# Patient Record
Sex: Female | Born: 1950 | Race: White | Hispanic: No | Marital: Married | Smoking: Former smoker
Health system: Southern US, Community
[De-identification: ages and names within clinical notes are randomized; demographics above are authoritative.]

## PROBLEM LIST (undated history)

## (undated) DIAGNOSIS — K219 Gastro-esophageal reflux disease without esophagitis: Secondary | ICD-10-CM

## (undated) DIAGNOSIS — C801 Malignant (primary) neoplasm, unspecified: Secondary | ICD-10-CM

## (undated) DIAGNOSIS — E785 Hyperlipidemia, unspecified: Secondary | ICD-10-CM

## (undated) HISTORY — PX: BACK SURGERY: SHX140

## (undated) HISTORY — DX: Gastro-esophageal reflux disease without esophagitis: K21.9

## (undated) HISTORY — DX: Hyperlipidemia, unspecified: E78.5

## (undated) HISTORY — PX: APPENDECTOMY: SHX54

## (undated) HISTORY — PX: ABDOMINAL HYSTERECTOMY: SHX81

## (undated) HISTORY — PX: FOOT SURGERY: SHX648

## (undated) HISTORY — PX: BREAST SURGERY: SHX581

---

## 1999-01-29 ENCOUNTER — Other Ambulatory Visit: Admission: RE | Admit: 1999-01-29 | Discharge: 1999-01-29 | Payer: Self-pay | Admitting: Gynecology

## 2000-03-03 ENCOUNTER — Other Ambulatory Visit: Admission: RE | Admit: 2000-03-03 | Discharge: 2000-03-03 | Payer: Self-pay | Admitting: Gynecology

## 2001-04-03 ENCOUNTER — Other Ambulatory Visit: Admission: RE | Admit: 2001-04-03 | Discharge: 2001-04-03 | Payer: Self-pay | Admitting: Gynecology

## 2002-04-12 ENCOUNTER — Other Ambulatory Visit: Admission: RE | Admit: 2002-04-12 | Discharge: 2002-04-12 | Payer: Self-pay | Admitting: Gynecology

## 2003-02-12 ENCOUNTER — Inpatient Hospital Stay (HOSPITAL_COMMUNITY): Admission: RE | Admit: 2003-02-12 | Discharge: 2003-02-14 | Payer: Self-pay | Admitting: Gynecology

## 2003-02-12 ENCOUNTER — Encounter (INDEPENDENT_AMBULATORY_CARE_PROVIDER_SITE_OTHER): Payer: Self-pay | Admitting: Specialist

## 2003-04-30 ENCOUNTER — Emergency Department (HOSPITAL_COMMUNITY): Admission: EM | Admit: 2003-04-30 | Discharge: 2003-04-30 | Payer: Self-pay | Admitting: Emergency Medicine

## 2003-04-30 ENCOUNTER — Encounter: Payer: Self-pay | Admitting: Emergency Medicine

## 2005-09-16 ENCOUNTER — Other Ambulatory Visit: Admission: RE | Admit: 2005-09-16 | Discharge: 2005-09-16 | Payer: Self-pay | Admitting: Gynecology

## 2007-06-02 IMAGING — US US ABDOMEN COMPLETE
1 series · 14 of 25 positions shown · non-contrast
Comparison: NONE

CLINICAL DATA: Family history of pancreatic cancer and question 
of  gallstones. 

ABDOMINAL ULTRASOUND

[Series 1: us abd · 0.33mm/px · 14 of 58 slices shown]
[im 1/58]
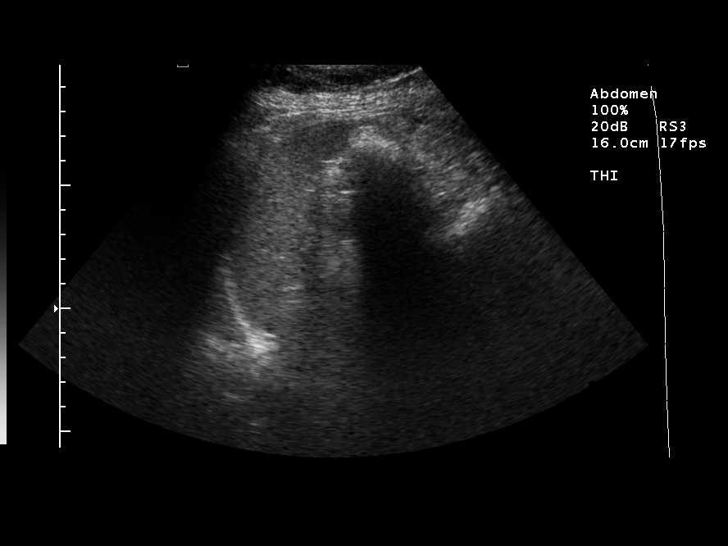
[im 5/58]
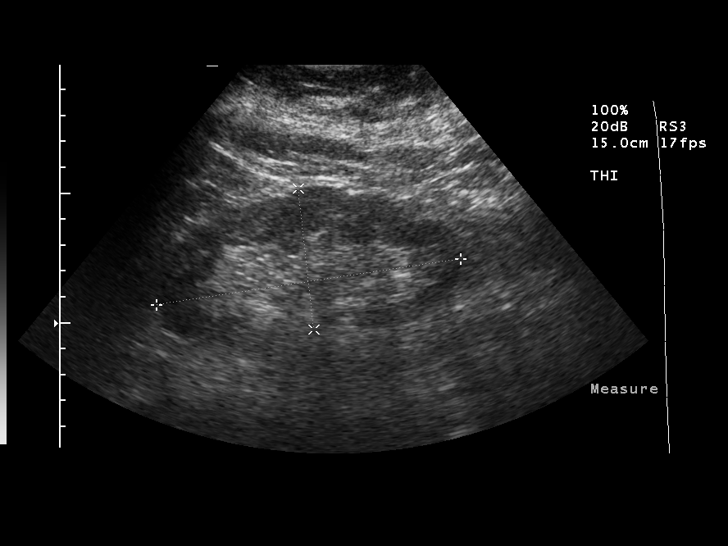
[im 10/58]
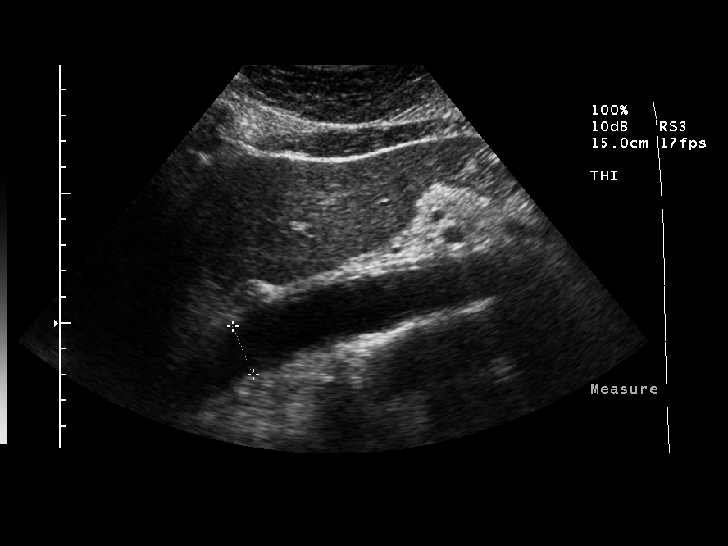
[im 15/58]
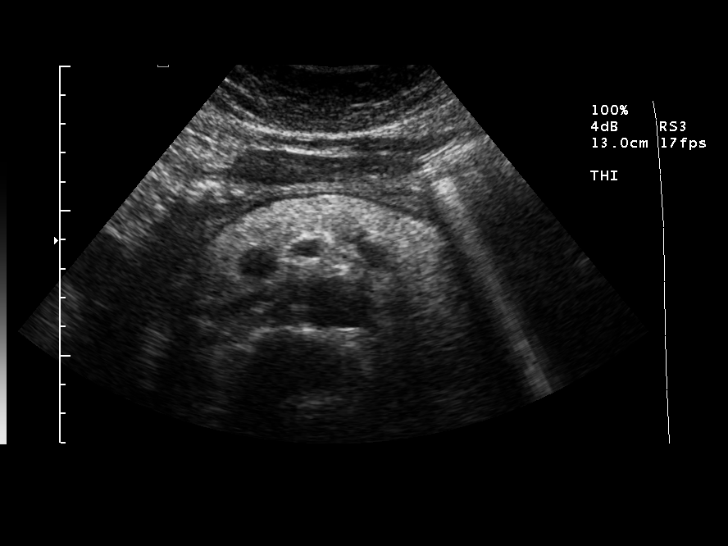
[im 20/58]
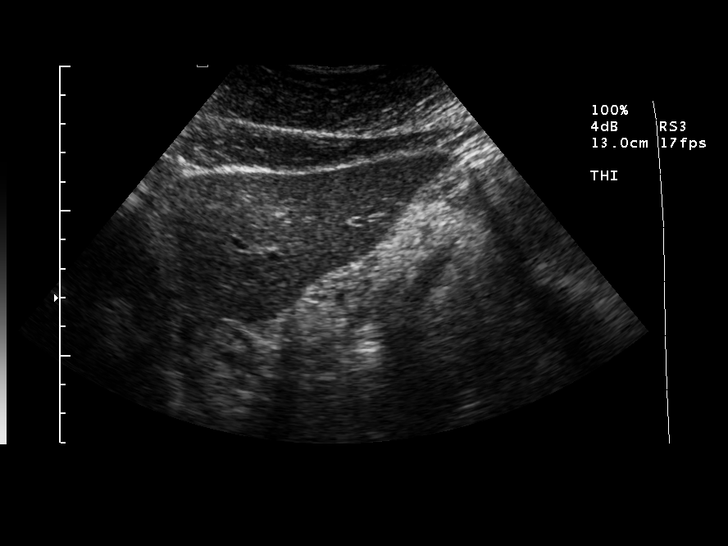
[im 22/58]
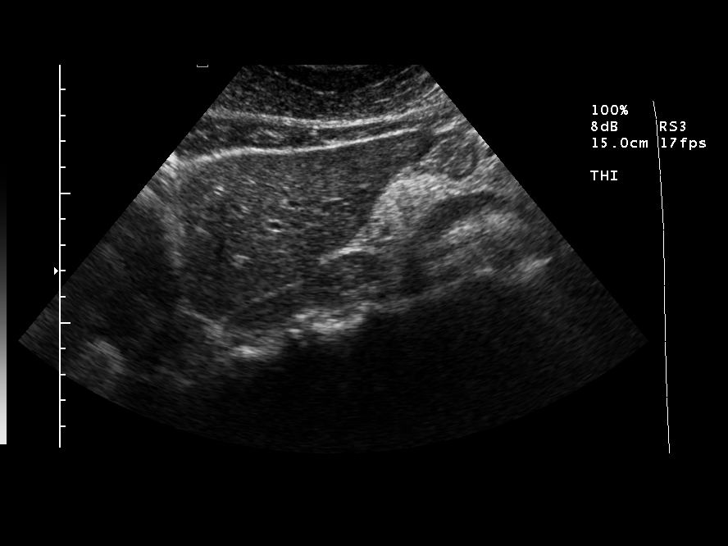
[im 27/58]
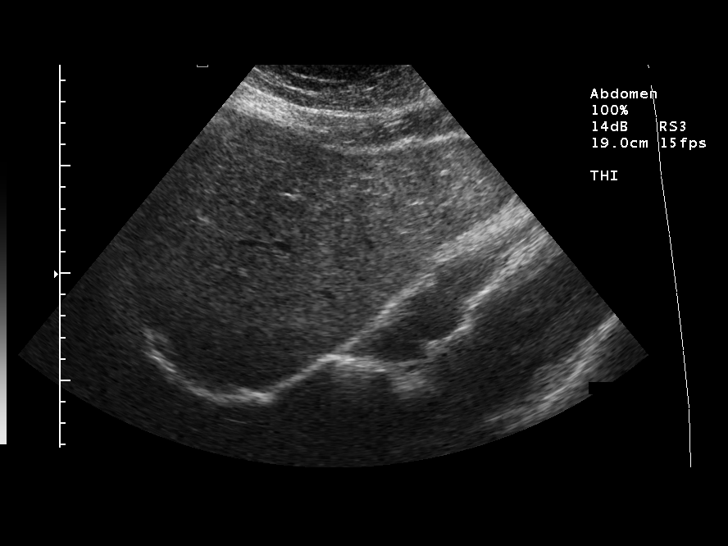
[im 31/58]
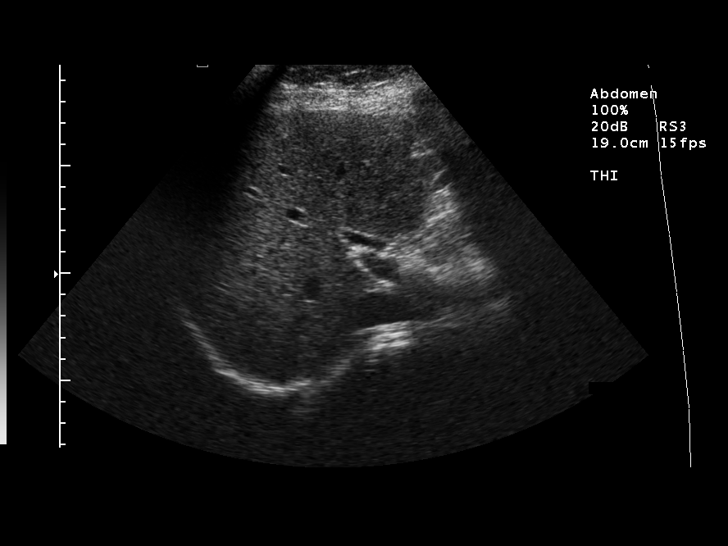
[im 36/58]
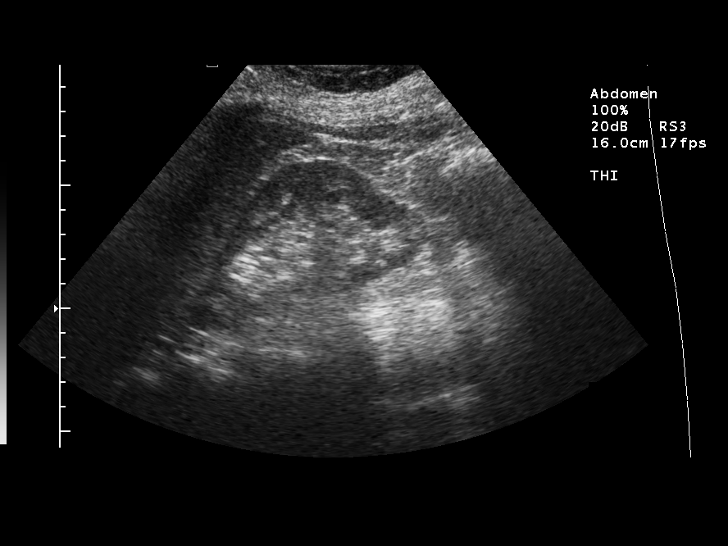
[im 39/58]
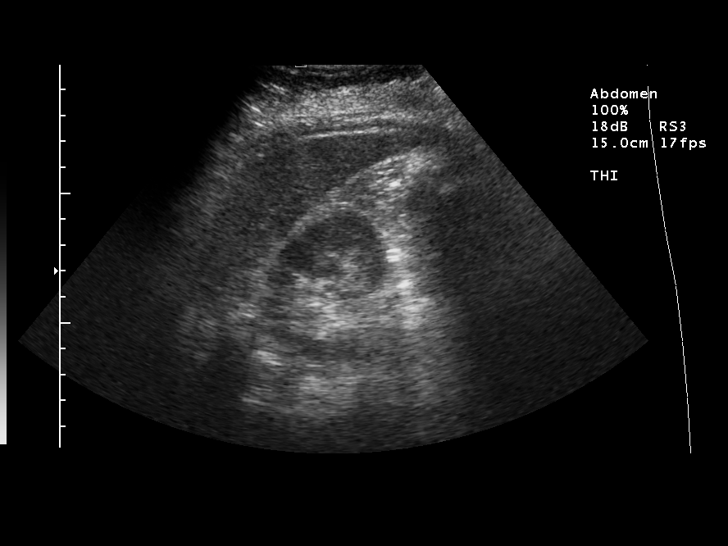
[im 43/58]
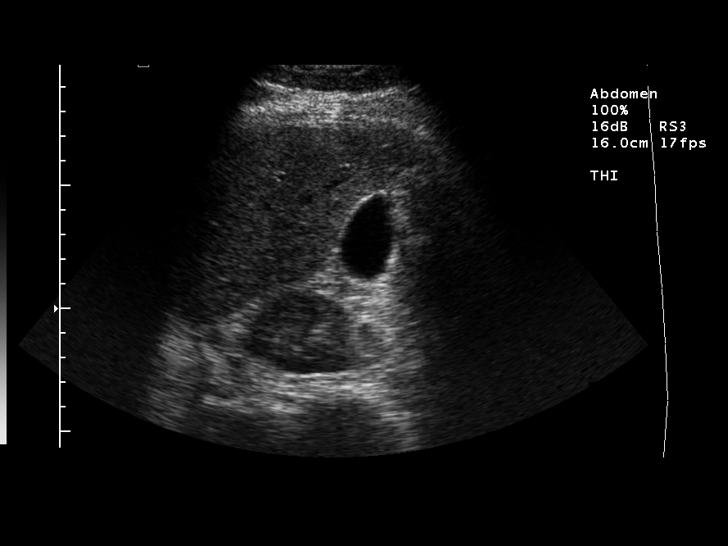
[im 48/58]
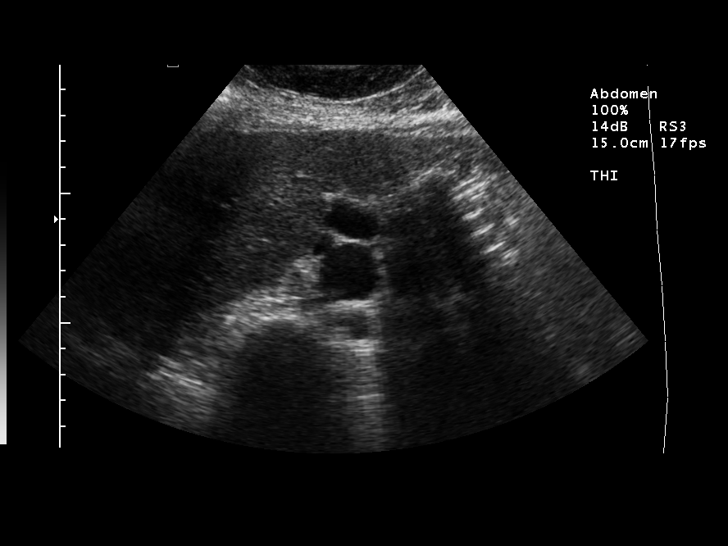
[im 53/58]
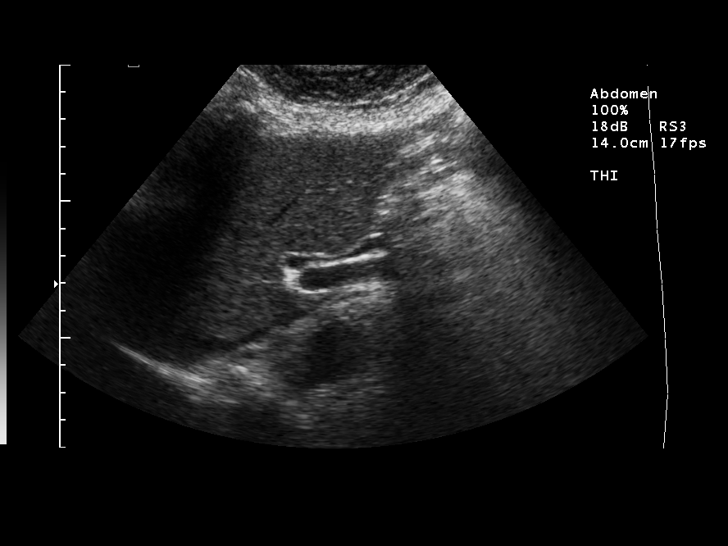
[im 58/58]
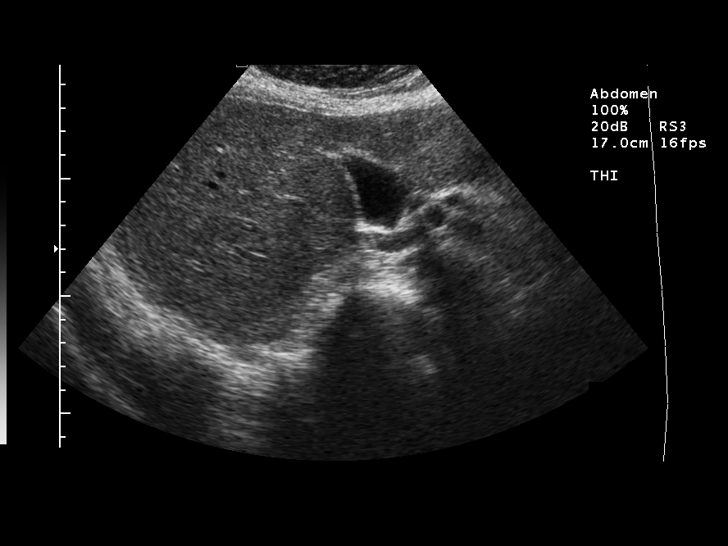

[14 of 25 positions shown; findings below may reference images not displayed]

FINDINGS: Spleen is normal in appearance. The left kidney is
x 5.5 x 4.7 cm.The right kidney is 10.6 x 5.5 x 3.9 cm. The aorta 
is normal in caliber. There is some scattered atherosclerotic 
calcification but no aneurysm or dilatation. The pancreas is 
normal in echogenicity and contour, with no pancreatic ductal 
dilatation or pancreatic masses. The liver is homogeneous. The 
gallbladder is normal in appearance, with no wall thickening or 
filling defects. The common duct is normal at 2.8 mm.
IMPRESSION: Atherosclerotic calcification in the aorta. Pancreas 
is normal. Gallbladder is normal with no intraluminal stones. 
Macey Phan, M.D. Electronically reviewed on 06/02/2007 Dict 
Date: 06/02/2007  Trans Date: 06/02/2007 DL  [REDACTED]

## 2009-05-26 ENCOUNTER — Encounter (INDEPENDENT_AMBULATORY_CARE_PROVIDER_SITE_OTHER): Payer: Self-pay | Admitting: Specialist

## 2009-05-26 ENCOUNTER — Ambulatory Visit (HOSPITAL_BASED_OUTPATIENT_CLINIC_OR_DEPARTMENT_OTHER): Admission: RE | Admit: 2009-05-26 | Discharge: 2009-05-27 | Payer: Self-pay | Admitting: Specialist

## 2010-08-05 ENCOUNTER — Ambulatory Visit: Payer: Self-pay | Admitting: Cardiology

## 2010-08-05 LAB — LIPID PANEL: Cholesterol: 175 mg/dL (ref 0–200)

## 2010-10-16 ENCOUNTER — Encounter: Payer: Self-pay | Admitting: Cardiology

## 2010-10-16 DIAGNOSIS — E785 Hyperlipidemia, unspecified: Secondary | ICD-10-CM | POA: Insufficient documentation

## 2010-10-16 DIAGNOSIS — K219 Gastro-esophageal reflux disease without esophagitis: Secondary | ICD-10-CM | POA: Insufficient documentation

## 2010-12-04 ENCOUNTER — Encounter: Payer: Self-pay | Admitting: Cardiology

## 2010-12-04 ENCOUNTER — Ambulatory Visit (INDEPENDENT_AMBULATORY_CARE_PROVIDER_SITE_OTHER): Payer: 59 | Admitting: Cardiology

## 2010-12-04 DIAGNOSIS — Z79899 Other long term (current) drug therapy: Secondary | ICD-10-CM

## 2010-12-04 DIAGNOSIS — E7800 Pure hypercholesterolemia, unspecified: Secondary | ICD-10-CM

## 2010-12-04 DIAGNOSIS — R109 Unspecified abdominal pain: Secondary | ICD-10-CM | POA: Insufficient documentation

## 2010-12-04 DIAGNOSIS — E785 Hyperlipidemia, unspecified: Secondary | ICD-10-CM

## 2010-12-04 DIAGNOSIS — I119 Hypertensive heart disease without heart failure: Secondary | ICD-10-CM

## 2010-12-04 LAB — LIPID PANEL
HDL: 52 mg/dL (ref >39)
Total CHOL/HDL Ratio: 3.1 Ratio

## 2010-12-04 LAB — COMPREHENSIVE METABOLIC PANEL
ALT: 11 U/L (ref 0–35)
AST: 16 U/L (ref 0–37)
Albumin: 4.2 g/dL (ref 3.5–5.2)
CO2: 27 mEq/L (ref 19–32)
Creat: 0.76 mg/dL (ref 0.40–1.20)

## 2010-12-04 NOTE — Assessment & Plan Note (Signed)
Continue present medication continue efforts at weight loss.

## 2010-12-04 NOTE — Assessment & Plan Note (Signed)
She has been to urgent care for her flank pain and nothing was found.  No evidence of urinary tract infection.  Apparently no gross hematuria or pyuria.  If she has more problems she will contact Dr. Isabel Caprice evaluate the possibility of a left kidney stone further

## 2010-12-04 NOTE — Progress Notes (Signed)
HPI: Recurrent flank pain, usually on left side.  No hematuria. No fever. Has not seen urollogy.If she has any recurrent problems she will contact her husbands urologist.  From a cardiac standpoint she's been doing well with no chest pain or shortness of breath.  No dizziness or syncope no side effects from the Lipitor.  No dizzy spells or syncope.  Current Outpatient Prescriptions  Medication Sig Dispense Refill  . atorvastatin (LIPITOR) 80 MG tablet Take 80 mg by mouth daily.        . Cholecalciferol (VITAMIN D) 1000 UNITS capsule Take 1,000 Units by mouth daily. OCCASIONALLY       . esomeprazole (NEXIUM) 40 MG capsule Take 40 mg by mouth daily.        Marland Kitchen estrogens, conjugated, (PREMARIN) 0.9 MG tablet Take 0.9 mg by mouth daily. Take daily for 21 days then do not take for 7 days.       Marland Kitchen HYDROCODONE-ACETAMINOPHEN PO Take by mouth as needed.        Marland Kitchen losartan-hydrochlorothiazide (HYZAAR) 100-12.5 MG per tablet Take 1 tablet by mouth daily.        . Multiple Vitamin (MULTIVITAMIN) tablet Take 1 tablet by mouth daily.          Allergies  Allergen Reactions  . Sulfa Antibiotics     Patient Active Problem List  Diagnoses  . Hyperlipidemia  . GERD (gastroesophageal reflux disease)    History  Smoking status  . Former Smoker  Smokeless tobacco  . Not on file    History  Alcohol Use     Family History  Problem Relation Age of Onset  . Coronary artery disease Father   . Coronary artery disease Mother     Review of Systems: The patient denies any heat or cold intolerance.  No weight gain or weight loss.  The patient denies headaches or blurry vision.  There is no cough or sputum production.  The patient denies dizziness.  There is no hematuria or hematochezia.  The patient denies any muscle aches or arthritis.  The patient denies any rash.  The patient denies frequent falling or instability.  There is no history of depression or anxiety.  All other systems were reviewed and are  negative.   Physical Exam: Vital signs as recorded.Pupils equal and reactive.   Extraocular Movements are full.  There is no scleral icterus.  The mouth and pharynx are normal.  The neck is supple.  The carotids reveal no bruits.  The jugular venous pressure is normal.  The thyroid is not enlarged.  There is no lymphadenopathy.The chest is clear to percussion and auscultation. There are no rales or rhonchi. Expansion of the chest is symmetrical.The precordium is quiet.  The first heart sound is normal.  The second heart sound is physiologically split.  There is no murmur gallop rub or click.  There is no abnormal lift or heave.The abdomen is soft and nontender. Bowel sounds are normal. The liver and spleen are not enlarged. There Are no abdominal masses. There are no bruits.The pedal pulses are good.  There is no phlebitis or edema.  There is no cyanosis or clubbing. There is no flank tenderness   Assessment / Plan:

## 2010-12-07 NOTE — Progress Notes (Signed)
Quick Note:  Continue same Rx. Trigllycerides still high. Strict diet! ______

## 2010-12-15 NOTE — Progress Notes (Signed)
Left message for patient to call back  

## 2010-12-16 ENCOUNTER — Telehealth: Payer: Self-pay | Admitting: *Deleted

## 2010-12-16 NOTE — Telephone Encounter (Signed)
Message copied by Regis Bill on Wed Dec 16, 2010 12:22 PM ------      Message from: Cassell Clement      Created: Mon Dec 07, 2010  3:12 PM       Continue same Rx.  Trigllycerides still high.  Strict diet!

## 2010-12-16 NOTE — Telephone Encounter (Signed)
Advised patient of labs 

## 2010-12-18 LAB — BASIC METABOLIC PANEL
Calcium: 9.1 mg/dL (ref 8.4–10.5)
GFR calc Af Amer: >60 mL/min (ref >60)
GFR calc non Af Amer: >60 mL/min (ref >60)
Glucose, Bld: 100 mg/dL — ABNORMAL HIGH (ref 70–99)
Potassium: 3.8 mEq/L (ref 3.5–5.1)
Sodium: 136 mEq/L (ref 135–145)

## 2010-12-18 LAB — POCT HEMOGLOBIN-HEMACUE: Hemoglobin: 12.8 g/dL (ref 12.0–15.0)

## 2010-12-28 ENCOUNTER — Other Ambulatory Visit: Payer: Self-pay | Admitting: Cardiology

## 2010-12-28 DIAGNOSIS — I10 Essential (primary) hypertension: Secondary | ICD-10-CM

## 2010-12-29 NOTE — Telephone Encounter (Signed)
Refill per escribe request.  

## 2011-01-08 ENCOUNTER — Telehealth: Payer: Self-pay | Admitting: Cardiology

## 2011-01-08 NOTE — Telephone Encounter (Signed)
WANTS TO TALK TO YOU ABOUT MEDICATIONS. IF NOT AT HOME TRY CELL [phone]

## 2011-01-08 NOTE — Telephone Encounter (Signed)
Prescribing doctor for her premarin is out of town and will not refill med until seen on Thursday may 3.  Wanted to know if you would ok Monday when back.  Dose of premarin is .625 not .9.  Please advise

## 2011-01-10 NOTE — Telephone Encounter (Signed)
Okay to refill small quantity of Premarin until she is able to see her gynecologist in May

## 2011-01-11 NOTE — Telephone Encounter (Signed)
When called patient she stated another covering phyiscian phoned in premarin. No need to refill

## 2011-01-29 NOTE — Op Note (Signed)
NAME:  Veronica Mcclain, Veronica Mcclain                          ACCOUNT NO.:  [id number]   MEDICAL RECORD NO.:  [id number]                   PATIENT TYPE:  INP   LOCATION:  9305                                 FACILITY:  [facility]   PHYSICIAN:  Luvenia Redden, M.D.                DATE OF BIRTH:  01-31-1951   DATE OF PROCEDURE:  02/12/2003  DATE OF DISCHARGE:                                 OPERATIVE REPORT   PREOPERATIVE DIAGNOSIS:  1. Uterine prolapse.  2. Chronic pelvic pain, dyspareunia and dysmenorrhea.  3. Perimenopausal female.   POSTOPERATIVE DIAGNOSIS:  1. Uterine prolapse.  2. Chronic pelvic pain, dyspareunia and dysmenorrhea.  3. Perimenopausal female.   OPERATION PERFORMED:  Laparoscopic-assisted total vaginal hysterectomy and  bilateral salpingo-oophorectomy.   SURGEON:  Luvenia Redden, M.D.   ASSISTANT:  Malva Limes, M.D.   DESCRIPTION OF PROCEDURE:  Under good anesthesia the patient was prepped and  draped in sterile manner.  Transverse incision at the lower border of the  umbilicus was made.  Veress needle was inserted intraperitoneally and  pneumoperitoneum formed with CO2.  The Veress needle was removed and  disposable 10/11 mm trocar and cannula was inserted through the  supraumbilical incision, the trocar removed and the laparoscope  introduced.  Under direct vision 5 mm operative disposable trocars were introduced in the  right and left lower quadrants under direct vision so as not to injure the  abdominal wall vessels or the intestines.  Pelvis and lower abdomen was  viewed.  There were a few filmy adhesions between her appendectomy scar to  the anterior abdominal wall.  In the pelvis the uterus appeared to be normal  size.  It was freely movable in the pelvis.  One could see the site of the  bladder flap repair from her previous Cesarean section.  One also notes that  she had a previous tubal sterilization.  Other than that the tubes looked  normal and the ovaries  appeared to be normal although they were quite small.  There were some varicosities in the infundibulopelvic ligament on both sides  actually and some in the left mesosalpinx.  The right round ligament was  identified, cauterized doubly to 0 and then was divided using the tripolar  instrument.  The right infundibulopelvic ligament was then cauterized  completely for a span of about 1 cm and it was then divided so as to free up  the ovary.  This was done in several bites.  The mesentery then was  cauterized and divided down the area of the round ligament.  On the left  side the round ligament was cauterized and divided in two bites.  The  infundibulopelvic ligament on the left side then was cauterized and divided  in multiple bites so as to free up the ovary.  The mesentery was then  cauterized and divided down to the round ligament on the  left side.  This  freed up the left tube and ovary.  This part of the procedure was then  terminated after the stumps were viewed and found to have no bleeding.  This  was done under reduced pressure as well.  The patient was repositioned with  the legs a little higher.  The vaginal approach was then begun.  The  retractor was placed in the vagina.  The cervix was grasped with a  tenaculum.  Paracervical tissues were infiltrated with Neo-Synephrine saline  solution.  The cervix was circumcised and mucosa was dissected away by sharp  and blunt dissection.  The cul-de-sac was entered posteriorly sharply.  There were no adhesions found in the cul-de-sac.  The bladder flap was then  developed by dissecting the bladder off the cervix in the lower uterine  segment bluntly.  Peritoneum was entered anteriorly.  The uterosacral  ligaments were then clamped, cut and ligated on either side.  The cardinal  ligaments were clamped, cut and ligated in two bites.  The broad ligaments  were then clamped, cut and ligated in two bites.  The fundus, tubes and  ovaries were  delivered through the vaginal incision and the remainder of the  broad ligament on either side was clamped, cut and doubly ligated.  The  specimen was handed off.  Stumps were inspected.  There was no active  bleeding.  The posterior vaginal cuff was run with a locked continuous 0  chromic catgut.  Stumps were again inspected.  No active bleeding was  noticed.  The peritoneum was closed with a pursestring suture of 0 Monocryl.  The vaginal cuff was then closed with a locked continuous 0 chromic catgut.  The bladder was catheterized.  There was clear urine coming from the  bladder.  At this time the patient was repositioned.  We changed gloves and  went above.  The abdomen was again insufflated with CO2 and the operative  site was inspected using the laparoscope.  Stumps were all dry.  There was  no active bleeding.  Both ureters were seen to be functioning normally.  The  stumps were inspected again at reduced pressure and there was no active  bleeding.  The pneumoperitoneum was fully released.  Skin incisions were  closed with subcuticular 3-0 plain catgut and Band-Aids were applied.  Estimated blood loss was 200 cc, none was replaced.  The patient tolerated  the procedure nicely and she was removed to the recovery room in good  condition.                                               Luvenia Redden, M.D.    WSB/MEDQ  D:  02/12/2003  T:  02/12/2003  Job:  [id number]

## 2011-01-29 NOTE — Discharge Summary (Signed)
NAME:  Veronica Mcclain, Veronica Mcclain                          ACCOUNT NO.:  [id number]   MEDICAL RECORD NO.:  [id number]                   PATIENT TYPE:  INP   LOCATION:  9305                                 FACILITY:  [facility]   PHYSICIAN:  Luvenia Redden, M.D.                DATE OF BIRTH:  10/13/1950   DATE OF ADMISSION:  02/12/2003  DATE OF DISCHARGE:  02/14/2003                                 DISCHARGE SUMMARY   HISTORY OF PRESENT ILLNESS:  This patient is a 60 year old female admitted  to the hospital for complaints of chronic pelvic pain, dysmenorrhea,  dyspareunia, and also that her bottom was falling out.  The patient has  had these complaints for some time but it has been worsening.  She is now  admitted for definitive therapy.  Pertinent findings on physical exam were  limited to the pelvis where the outlet was parous, bladder and rectum were  fairly well supported, and the vaginal mucosa is normal.  The cervix  prolapses to the vaginal introitus.  The corpus is normal size and is  prolapsed with the cervix.  There are no palpable adnexal masses.  The cul-  de-sac is free of masses and rectovaginal exam was confirmatory.   LABORATORY DATA:  Preoperative laboratory data included a CBC that was  normal.  Preoperative hemoglobin was 13, hematocrit 41.  Urinalysis was  normal for a voided specimen.  Postoperative hemoglobin 12.8 with hematocrit  of 38.8.   COURSE IN THE HOSPITAL:  The patient was taken to the operating room where  laparoscopy-assisted total vaginal hysterectomy and bilateral salpingo-  oophorectomy was accomplished without complication.  Blood loss was 200 mL  and none was replaced.  Postoperatively the patient did well.  On February 13, 2003 she was afebrile, was taking p.o. fluids, the catheter was removed but  she had not voided that morning.  She was changed to p.o. medications  waiting to see if she could void spontaneously and she had passed no flatus.  By the morning of  June 3 she was taking solids and fluids well.  She had  passed flatus and was ambulating well, and she had voided spontaneously and  without effort.  She was discharged to her home to be followed in the office  for postoperative care.  The patient was instructed to limit activity, no  vaginal penetration, no heavy lifting or vigorous activity.  She was to  return to the office in four weeks time for follow-up care.  She was given a  prescription for [facility] for pain.  Told to contact me or the office  in the event of abnormal bleeding, pain or fever.  She was also given a  prescription of Premarin 0.625 mg to be taken by mouth daily.  Examination  of the tissue removed was all benign.   FINAL DIAGNOSES:  1. Uterine prolapse.  2.  Chronic pelvic pain, dysmenorrhea, and dyspareunia.  3. Benign endometrial polyp.  4. Leiomyomata uteri, multiple.   OPERATIONS:  Laparoscopy-assisted total vaginal hysterectomy and bilateral  salpingo-oophorectomy.   CONDITION ON DISCHARGE:  Improved.                                               Luvenia Redden, M.D.    WSB/MEDQ  D:  04/10/2003  T:  04/10/2003  Job:  [id number]

## 2011-03-18 ENCOUNTER — Telehealth: Payer: Self-pay | Admitting: Cardiology

## 2011-03-18 DIAGNOSIS — E7800 Pure hypercholesterolemia, unspecified: Secondary | ICD-10-CM

## 2011-03-18 DIAGNOSIS — Z79899 Other long term (current) drug therapy: Secondary | ICD-10-CM

## 2011-03-18 DIAGNOSIS — I119 Hypertensive heart disease without heart failure: Secondary | ICD-10-CM

## 2011-03-18 NOTE — Telephone Encounter (Signed)
Was calling to schedule from her recall letter. Prefers Fridays if possible. Please call back. I have pulled her chart.

## 2011-03-18 NOTE — Telephone Encounter (Signed)
Scheduled appointment for patient.

## 2011-03-25 ENCOUNTER — Encounter: Payer: Self-pay | Admitting: Cardiology

## 2011-04-23 ENCOUNTER — Other Ambulatory Visit: Payer: Self-pay | Admitting: Cardiology

## 2011-04-23 ENCOUNTER — Encounter: Payer: Self-pay | Admitting: Cardiology

## 2011-04-23 ENCOUNTER — Other Ambulatory Visit (INDEPENDENT_AMBULATORY_CARE_PROVIDER_SITE_OTHER): Payer: 59 | Admitting: *Deleted

## 2011-04-23 ENCOUNTER — Ambulatory Visit (INDEPENDENT_AMBULATORY_CARE_PROVIDER_SITE_OTHER): Payer: 59 | Admitting: Cardiology

## 2011-04-23 VITALS — BP 130/80 | HR 72 | Ht 69.0 in | Wt 179.0 lb

## 2011-04-23 DIAGNOSIS — Z79899 Other long term (current) drug therapy: Secondary | ICD-10-CM

## 2011-04-23 DIAGNOSIS — E7800 Pure hypercholesterolemia, unspecified: Secondary | ICD-10-CM

## 2011-04-23 DIAGNOSIS — E785 Hyperlipidemia, unspecified: Secondary | ICD-10-CM

## 2011-04-23 DIAGNOSIS — I119 Hypertensive heart disease without heart failure: Secondary | ICD-10-CM

## 2011-04-23 LAB — BASIC METABOLIC PANEL
Calcium: 9.2 mg/dL (ref 8.4–10.5)
GFR: 73.68 mL/min (ref >60.00)
Glucose, Bld: 100 mg/dL — ABNORMAL HIGH (ref 70–99)
Sodium: 141 mEq/L (ref 135–145)

## 2011-04-23 LAB — LDL CHOLESTEROL, DIRECT: Direct LDL: 115.7 mg/dL

## 2011-04-23 LAB — HEPATIC FUNCTION PANEL
ALT: 17 U/L (ref 0–35)
AST: 17 U/L (ref 0–37)
Total Bilirubin: 0.8 mg/dL (ref 0.3–1.2)
Total Protein: 7.4 g/dL (ref 6.0–8.3)

## 2011-04-23 LAB — LIPID PANEL
Cholesterol: 202 mg/dL — ABNORMAL HIGH (ref 0–200)
Triglycerides: 211 mg/dL — ABNORMAL HIGH (ref 0.0–149.0)

## 2011-04-23 NOTE — Progress Notes (Signed)
Veronica Mcclain Date of Birth:  [birth year] [facility] [address]   [address] [place], [place]  [address] [phone]           Fax   [phone]  HPI: This pleasant 60 year old married Caucasian woman is seen for a scheduled six-month followup office visit.  She has a past history of hyperlipidemia.  She is a very strong family history of coronary disease.  She herself has had no known ischemic heart disease and she had a normal Cardiolite stress test in 2005.  She's had a history of essential hypertension and is on losartan HCT and is tolerating it well.  Not having a chest pain or shortness of breath.  He has had some occasional epigastric discomfort and Nexium helps.  She wonders about gallstones but we did do a full ultrasound of her abdomen on 06/02/07 which showed no evidence of any gallbladder pathology.  Current Outpatient Prescriptions  Medication Sig Dispense Refill  . atorvastatin (LIPITOR) 80 MG tablet Take 80 mg by mouth daily.        . Cholecalciferol (VITAMIN D) 1000 UNITS capsule Take 1,000 Units by mouth daily. OCCASIONALLY       . esomeprazole (NEXIUM) 40 MG capsule Take 40 mg by mouth daily.        Marland Kitchen estrogens, conjugated, (PREMARIN) 0.9 MG tablet Take 0.9 mg by mouth daily. Take daily for 21 days then do not take for 7 days.       Marland Kitchen HYDROCODONE-ACETAMINOPHEN PO Take by mouth as needed.        Marland Kitchen losartan-hydrochlorothiazide (HYZAAR) 100-12.5 MG per tablet TAKE 1 TABLET EVERY DAY  90 tablet  3  . Multiple Vitamin (MULTIVITAMIN) tablet Take 1 tablet by mouth daily.          Allergies  Allergen Reactions  . Sulfa Antibiotics     Patient Active Problem List  Diagnoses  . Hyperlipidemia  . GERD (gastroesophageal reflux disease)  . Flank pain    History  Smoking status  . Former Smoker  Smokeless tobacco  . Not on file    History  Alcohol Use     Family History  Problem Relation Age of Onset  . Coronary artery disease Father     . Coronary artery disease Mother     Review of Systems: The patient denies any heat or cold intolerance.  No weight gain or weight loss.  The patient denies headaches or blurry vision.  There is no cough or sputum production.  The patient denies dizziness.  There is no hematuria or hematochezia.  The patient denies any muscle aches or arthritis.  The patient denies any rash.  The patient denies frequent falling or instability.  There is no history of depression or anxiety.  All other systems were reviewed and are negative.   Physical Exam: Filed Vitals:   04/23/11 1128  BP: 130/80  Pulse: 72   The general appearance feels a well-developed well-nourished woman in no distress.The head and neck exam reveals pupils equal and reactive.  Extraocular movements are full.  There is no scleral icterus.  The mouth and pharynx are normal.  The neck is supple.  The carotids reveal no bruits.  The jugular venous pressure is normal.  The  thyroid is not enlarged.  There is no lymphadenopathy.  The chest is clear to percussion and auscultation.  There are no rales or rhonchi.  Expansion of the chest is symmetrical.  The precordium is  quiet.  The first heart sound is normal.  The second heart sound is physiologically split.  There is no murmur gallop rub or click.  There is no abnormal lift or heave.  The abdomen is soft and nontender.  The bowel sounds are normal.  The liver and spleen are not enlarged.  There are no abdominal masses.  There are no abdominal bruits.  Extremities reveal good pedal pulses.  There is no phlebitis or edema.  There is no cyanosis or clubbing.  Strength is normal and symmetrical in all extremities.  There is no lateralizing weakness.  There are no sensory deficits.  The skin is warm and dry.  There is no rash.     Assessment / Plan: Continue same medication.  Recheck in 6 months for followup office visit with lab work including fasting lipid panel and chemistries

## 2011-04-23 NOTE — Assessment & Plan Note (Signed)
The patient is on atorvastatin 80 mg a day.  She's not having any adverse effects from the atorvastatin in terms of myalgias.  Blood work today is pending.

## 2011-04-26 ENCOUNTER — Telehealth: Payer: Self-pay | Admitting: *Deleted

## 2011-04-26 NOTE — Telephone Encounter (Signed)
Advised patient of labs 

## 2011-08-27 ENCOUNTER — Other Ambulatory Visit: Payer: 59 | Admitting: *Deleted

## 2011-08-27 ENCOUNTER — Ambulatory Visit: Payer: 59 | Admitting: Cardiology

## 2011-09-16 ENCOUNTER — Ambulatory Visit (INDEPENDENT_AMBULATORY_CARE_PROVIDER_SITE_OTHER): Payer: 59 | Admitting: Cardiology

## 2011-09-16 ENCOUNTER — Other Ambulatory Visit: Payer: Self-pay | Admitting: Cardiology

## 2011-09-16 ENCOUNTER — Other Ambulatory Visit (INDEPENDENT_AMBULATORY_CARE_PROVIDER_SITE_OTHER): Payer: 59 | Admitting: *Deleted

## 2011-09-16 ENCOUNTER — Encounter: Payer: Self-pay | Admitting: Cardiology

## 2011-09-16 VITALS — BP 128/78 | HR 70 | Ht 69.0 in | Wt 185.0 lb

## 2011-09-16 DIAGNOSIS — E7800 Pure hypercholesterolemia, unspecified: Secondary | ICD-10-CM

## 2011-09-16 DIAGNOSIS — K219 Gastro-esophageal reflux disease without esophagitis: Secondary | ICD-10-CM

## 2011-09-16 DIAGNOSIS — I119 Hypertensive heart disease without heart failure: Secondary | ICD-10-CM

## 2011-09-16 DIAGNOSIS — E785 Hyperlipidemia, unspecified: Secondary | ICD-10-CM

## 2011-09-16 LAB — LIPID PANEL: Triglycerides: 247 mg/dL — ABNORMAL HIGH (ref 0.0–149.0)

## 2011-09-16 LAB — BASIC METABOLIC PANEL
BUN: 11 mg/dL (ref 6–23)
Calcium: 9.3 mg/dL (ref 8.4–10.5)
GFR: 83.86 mL/min (ref >60.00)
Glucose, Bld: 87 mg/dL (ref 70–99)

## 2011-09-16 LAB — HEPATIC FUNCTION PANEL
ALT: 18 U/L (ref 0–35)
AST: 20 U/L (ref 0–37)
Bilirubin, Direct: 0.1 mg/dL (ref 0.0–0.3)
Total Bilirubin: 0.6 mg/dL (ref 0.3–1.2)

## 2011-09-16 NOTE — Assessment & Plan Note (Signed)
The patient is on Lipitor 80 mg daily for her cholesterol.  She is trying to watch her diet but over the holidays she gained 6 pounds.  Blood work today is pending.  Not having any myalgias from the Lipitor.

## 2011-09-16 NOTE — Progress Notes (Signed)
  Veronica Mcclain Date of Birth:  18-Feb-1951 [facility] [address]   [address] [place], [place]  [address] [phone]           Fax   [phone]  HPI: This pleasant 61 year old Caucasian female is seen for a scheduled four-month followup office visit.  She has a history of hyperlipidemia and a history of essential hypertension.  She does not have any history of ischemic heart disease.  She had a normal Cardiolite stress test in 2005.  She has continued to have some epigastric discomfort.  Doubling up on her Nexium has helped.  She wonders about gallstones but she did have a normal ultrasound of her abdomen and 2008 which did not show any gallbladder pathology.  Current Outpatient Prescriptions  Medication Sig Dispense Refill  . atorvastatin (LIPITOR) 80 MG tablet Take 80 mg by mouth daily.        . Cholecalciferol (VITAMIN D) 1000 UNITS capsule Take 1,000 Units by mouth daily. OCCASIONALLY       . esomeprazole (NEXIUM) 40 MG capsule Take 40 mg by mouth daily.        Marland Kitchen estrogens, conjugated, (PREMARIN) 0.9 MG tablet Take 0.9 mg by mouth daily. Take daily for 21 days then do not take for 7 days.       . Flavocoxid (LIMBREL PO) Take by mouth 2 (two) times daily.        Marland Kitchen HYDROCODONE-ACETAMINOPHEN PO Take by mouth as needed.        Marland Kitchen losartan-hydrochlorothiazide (HYZAAR) 100-12.5 MG per tablet TAKE 1 TABLET EVERY DAY  90 tablet  3  . Multiple Vitamin (MULTIVITAMIN) tablet Take 1 tablet by mouth daily.          Allergies  Allergen Reactions  . Sulfa Antibiotics     Patient Active Problem List  Diagnoses  . Hyperlipidemia  . GERD (gastroesophageal reflux disease)  . Flank pain  . Benign hypertensive heart disease without heart failure    History  Smoking status  . Former Smoker  Smokeless tobacco  . Not on file    History  Alcohol Use     Family History  Problem Relation Age of Onset  . Coronary artery disease Father   . Coronary artery  disease Mother     Review of Systems: The patient denies any heat or cold intolerance.  No weight gain or weight loss.  The patient denies headaches or blurry vision.  There is no cough or sputum production.  The patient denies dizziness.  There is no hematuria or hematochezia.  The patient denies any muscle aches or arthritis.  The patient denies any rash.  The patient denies frequent falling or instability.  There is no history of depression or anxiety.  All other systems were reviewed and are negative.   Physical Exam: Filed Vitals:   09/16/11 1129  BP: 128/78  Pulse: 70      Assessment / Plan:

## 2011-09-16 NOTE — Assessment & Plan Note (Signed)
The patient continues to have symptoms of epigastric discomfort which has improved but not totally subsided on doubling up on her Nexium.  She has seen Dr. Matthias Hughs in the past and she will call his office for a followup evaluation of her gastrointestinal symptoms.

## 2011-09-16 NOTE — Assessment & Plan Note (Signed)
The patient denies any exertional chest pain.  She's not having any dizziness or syncope.  No palpitations.

## 2011-09-16 NOTE — Patient Instructions (Signed)
Work on diet and weight loss Your physician recommends that you continue on your current medications as directed. Please refer to the Current Medication list given to you today. Your physician wants you to follow-up in: 4 months You will receive a reminder letter in the mail two months in advance. If you don't receive a letter, please call our office to schedule the follow-up appointment.

## 2011-09-17 ENCOUNTER — Telehealth: Payer: Self-pay | Admitting: *Deleted

## 2011-09-17 ENCOUNTER — Telehealth: Payer: Self-pay | Admitting: Cardiology

## 2011-09-17 NOTE — Telephone Encounter (Signed)
Message copied by Burnell Blanks on Fri Sep 17, 2011  4:37 PM ------      Message from: Cassell Clement      Created: Thu Sep 16, 2011  6:33 PM       The electrolytes and the liver function studies are normal.  The triglycerides and cholesterol are still too high.  Stay on 80 mg Lipitor every day and work harder on diet and attempts at weight loss.

## 2011-09-17 NOTE — Telephone Encounter (Signed)
Advised of lab results 

## 2011-09-17 NOTE — Telephone Encounter (Signed)
Advised patient

## 2011-09-17 NOTE — Telephone Encounter (Signed)
Message copied by Burnell Blanks on Fri Sep 17, 2011  4:40 PM ------      Message from: Cassell Clement      Created: Thu Sep 16, 2011  6:34 PM       The LDL level has improved and is now just over the upper limit of normal.  Continue same Lipitor and careful diet

## 2011-09-17 NOTE — Telephone Encounter (Signed)
Follow-up: ° ° ° °Patient returned your phone call. °

## 2011-10-08 ENCOUNTER — Other Ambulatory Visit: Payer: Self-pay | Admitting: *Deleted

## 2011-10-08 MED ORDER — ATORVASTATIN CALCIUM 80 MG PO TABS: 80.0000 mg | ORAL_TABLET | Freq: Every day | ORAL | Status: DC

## 2011-10-08 NOTE — Telephone Encounter (Signed)
Refilled atorvastatin

## 2011-10-25 ENCOUNTER — Other Ambulatory Visit: Payer: Self-pay | Admitting: *Deleted

## 2011-10-25 MED ORDER — ESOMEPRAZOLE MAGNESIUM 40 MG PO CPDR: 40.0000 mg | DELAYED_RELEASE_CAPSULE | Freq: Every day | ORAL | Status: DC

## 2011-10-25 NOTE — Telephone Encounter (Signed)
Refilled nexium 

## 2011-12-02 ENCOUNTER — Encounter (HOSPITAL_BASED_OUTPATIENT_CLINIC_OR_DEPARTMENT_OTHER): Payer: Self-pay

## 2011-12-02 ENCOUNTER — Emergency Department (HOSPITAL_BASED_OUTPATIENT_CLINIC_OR_DEPARTMENT_OTHER)
Admission: EM | Admit: 2011-12-02 | Discharge: 2011-12-02 | Disposition: A | Discharge status: Home Health | Payer: 59 | Attending: Emergency Medicine | Admitting: Emergency Medicine

## 2011-12-02 ENCOUNTER — Emergency Department (INDEPENDENT_AMBULATORY_CARE_PROVIDER_SITE_OTHER): Payer: 59

## 2011-12-02 DIAGNOSIS — Z79899 Other long term (current) drug therapy: Secondary | ICD-10-CM | POA: Insufficient documentation

## 2011-12-02 DIAGNOSIS — E785 Hyperlipidemia, unspecified: Secondary | ICD-10-CM | POA: Insufficient documentation

## 2011-12-02 DIAGNOSIS — K219 Gastro-esophageal reflux disease without esophagitis: Secondary | ICD-10-CM | POA: Insufficient documentation

## 2011-12-02 DIAGNOSIS — I700 Atherosclerosis of aorta: Secondary | ICD-10-CM

## 2011-12-02 DIAGNOSIS — R110 Nausea: Secondary | ICD-10-CM | POA: Insufficient documentation

## 2011-12-02 DIAGNOSIS — R197 Diarrhea, unspecified: Secondary | ICD-10-CM

## 2011-12-02 DIAGNOSIS — R109 Unspecified abdominal pain: Secondary | ICD-10-CM | POA: Insufficient documentation

## 2011-12-02 DIAGNOSIS — N390 Urinary tract infection, site not specified: Secondary | ICD-10-CM | POA: Insufficient documentation

## 2011-12-02 DIAGNOSIS — R5381 Other malaise: Secondary | ICD-10-CM | POA: Insufficient documentation

## 2011-12-02 DIAGNOSIS — R1013 Epigastric pain: Secondary | ICD-10-CM

## 2011-12-02 DIAGNOSIS — R5383 Other fatigue: Secondary | ICD-10-CM | POA: Insufficient documentation

## 2011-12-02 DIAGNOSIS — R1110 Vomiting, unspecified: Secondary | ICD-10-CM

## 2011-12-02 HISTORY — DX: Malignant (primary) neoplasm, unspecified: C80.1

## 2011-12-02 LAB — URINE MICROSCOPIC-ADD ON

## 2011-12-02 LAB — DIFFERENTIAL
Eosinophils Relative: 2 % (ref 0–5)
Lymphocytes Relative: 24 % (ref 12–46)
Lymphs Abs: 1.7 10*3/uL (ref 0.7–4.0)
Monocytes Absolute: 0.9 10*3/uL (ref 0.1–1.0)

## 2011-12-02 LAB — CBC
HCT: 39.5 % (ref 36.0–46.0)
MCV: 86.6 fL (ref 78.0–100.0)
Platelets: 295 10*3/uL (ref 150–400)
RBC: 4.56 MIL/uL (ref 3.87–5.11)
WBC: 6.9 10*3/uL (ref 4.0–10.5)

## 2011-12-02 LAB — COMPREHENSIVE METABOLIC PANEL
Albumin: 3.6 g/dL (ref 3.5–5.2)
BUN: 7 mg/dL (ref 6–23)
Calcium: 9.1 mg/dL (ref 8.4–10.5)
Creatinine, Ser: 0.7 mg/dL (ref 0.50–1.10)
Total Protein: 6.7 g/dL (ref 6.0–8.3)

## 2011-12-02 LAB — URINALYSIS, ROUTINE W REFLEX MICROSCOPIC
Nitrite: NEGATIVE
Specific Gravity, Urine: 1.024 (ref 1.005–1.030)
pH: 6 (ref 5.0–8.0)

## 2011-12-02 LAB — LIPASE, BLOOD: Lipase: 40 U/L (ref 11–59)

## 2011-12-02 IMAGING — US US ABDOMEN COMPLETE
1 series · 14 of 25 positions shown · non-contrast
Comparison: CT 11/29/2011.  Ultrasound 06/02/2007.

CLINICAL DATA: Epigastric pain with vomiting and diarrhea.
History of appendectomy and hysterectomy.

COMPLETE ABDOMINAL ULTRASOUND

[Series 1: us abdomen complete · 0.32mm/px · 14 of 68 slices shown]
[im 1/68]
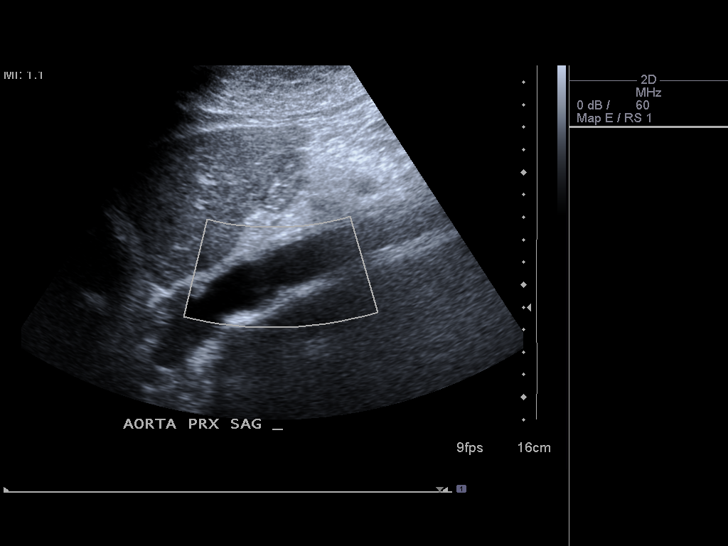
[im 6/68]
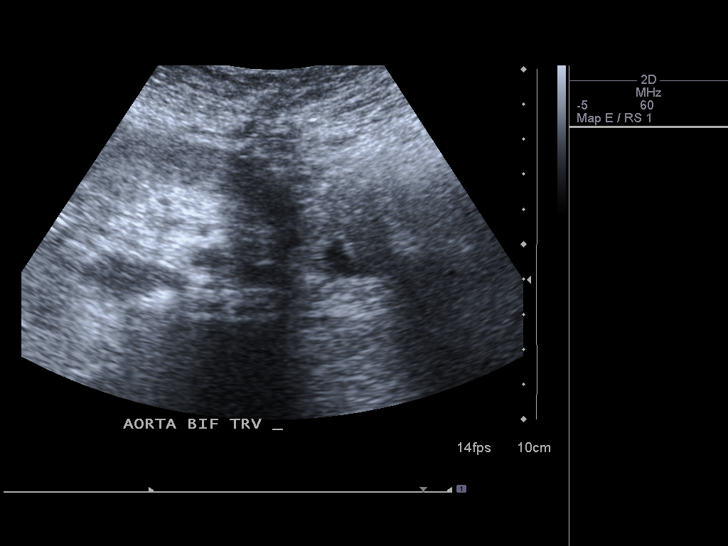
[im 12/68]
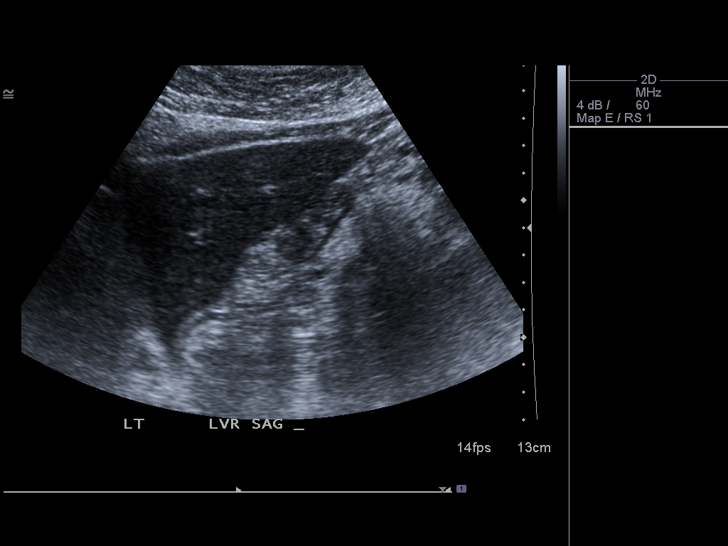
[im 17/68]
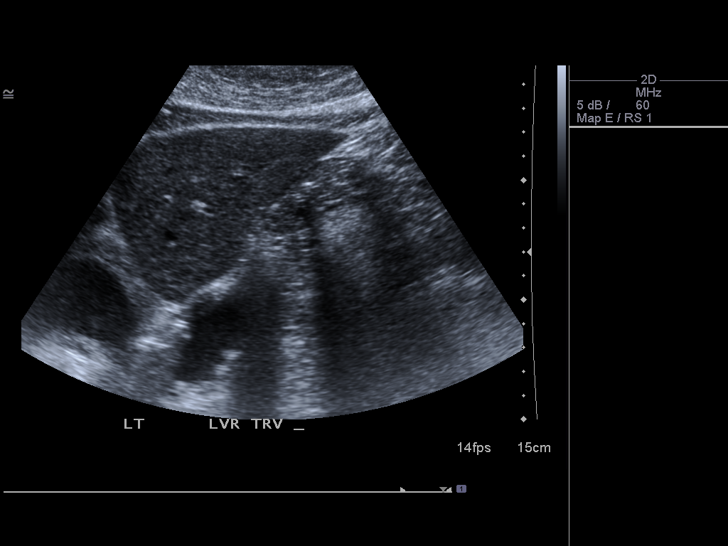
[im 23/68]
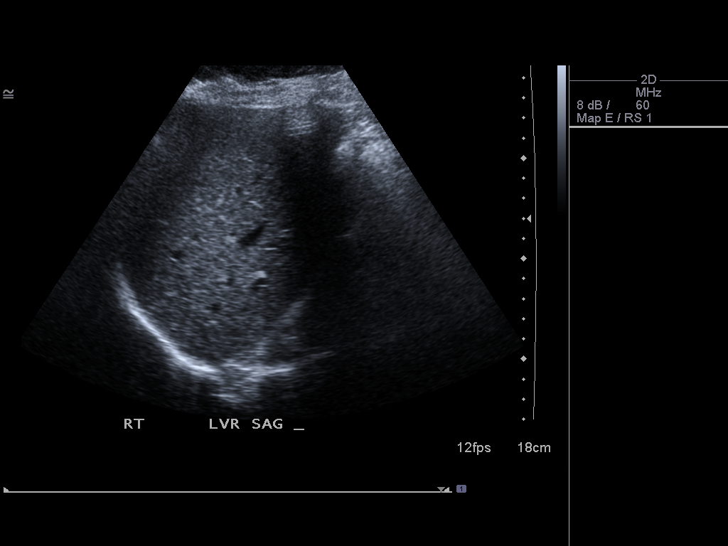
[im 26/68]
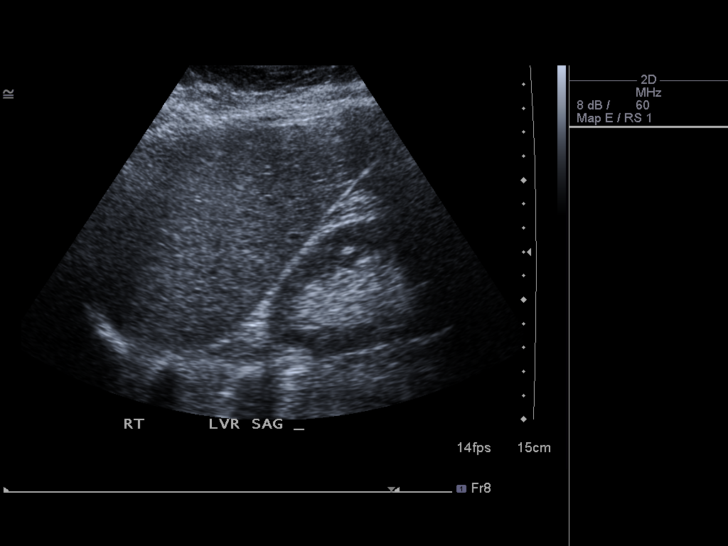
[im 31/68]
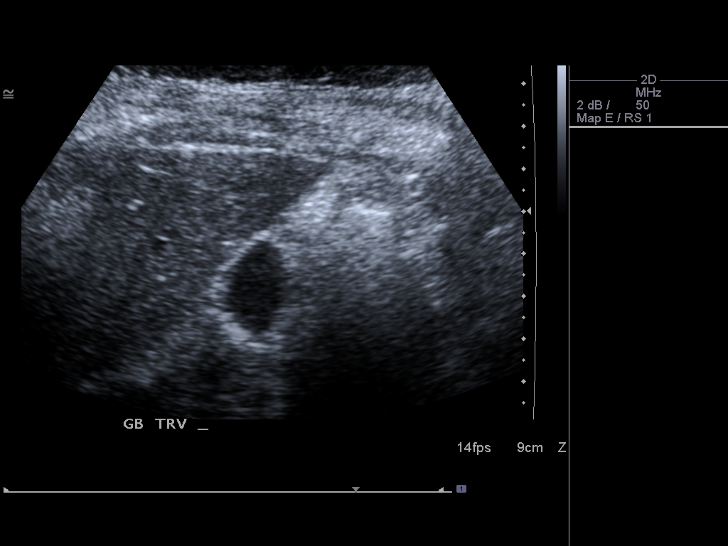
[im 37/68]
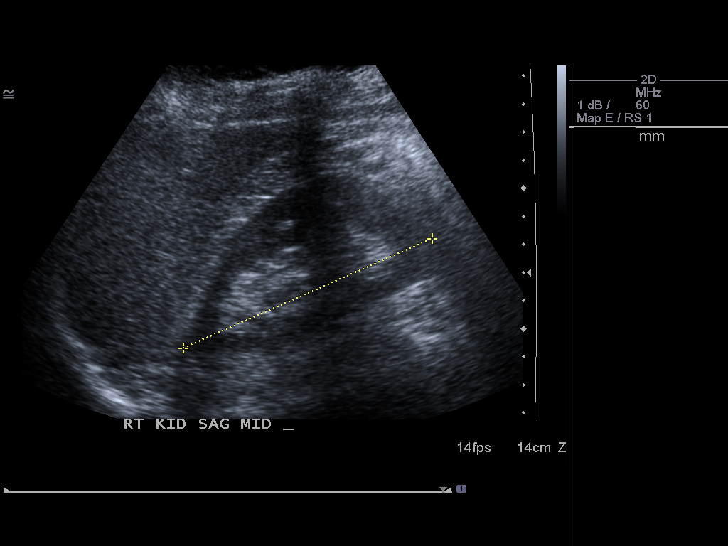
[im 42/68]
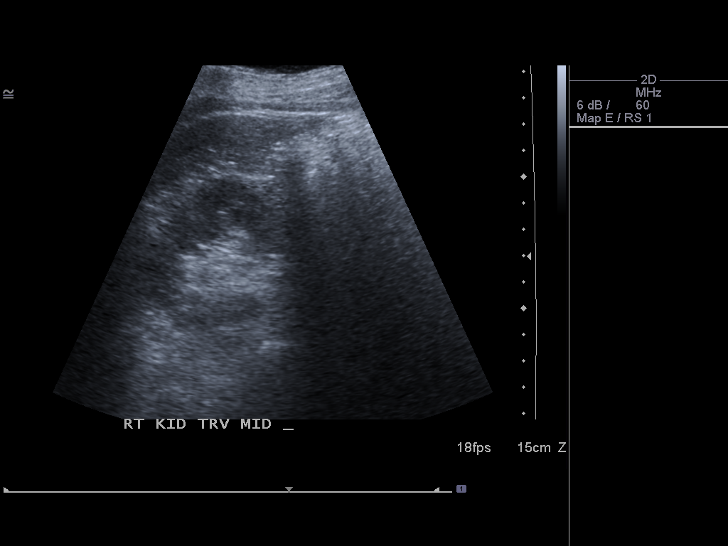
[im 45/68]
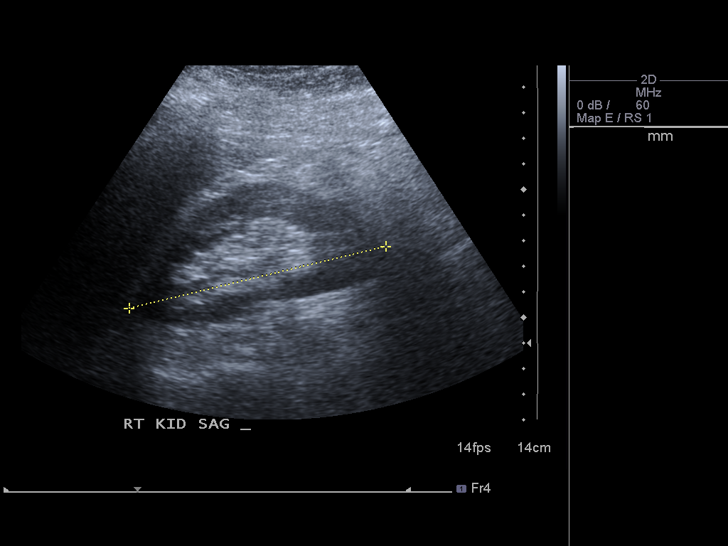
[im 51/68]
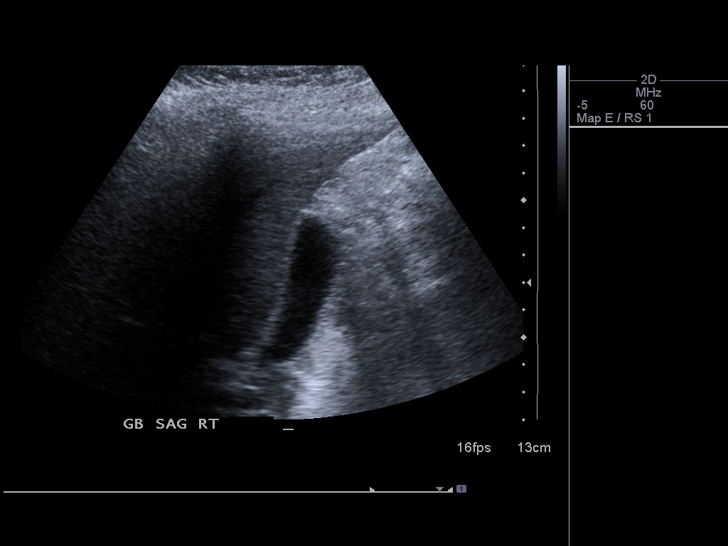
[im 56/68]
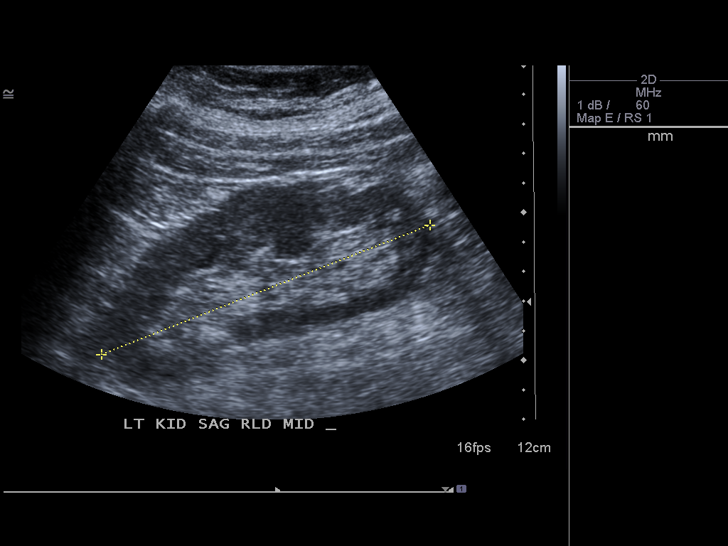
[im 62/68]
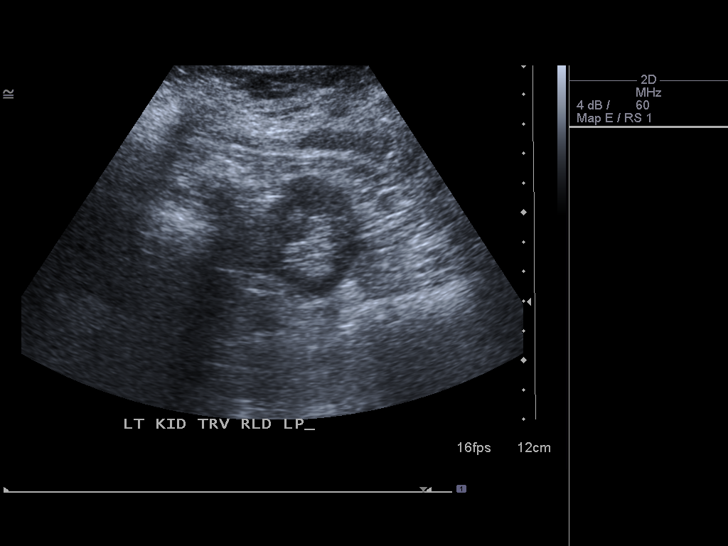
[im 68/68]
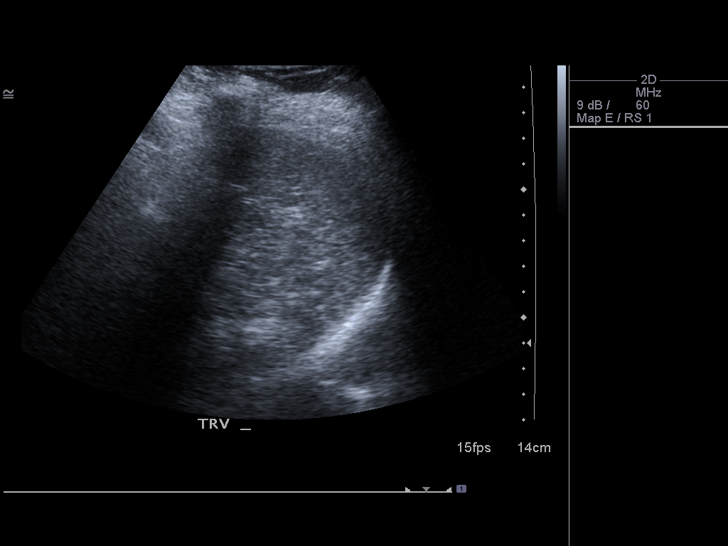

[14 of 25 positions shown; findings below may reference images not displayed]

FINDINGS: Gallbladder: Well distended without wall thickening, stones or
pericholecystic fluid. Negative sonographic Murphy's sign.

Common bile duct:   Normal in caliber without filling defects.

Liver:  Echogenicity is within normal limits.  No focal hepatic
abnormalities are identified.

IVC:  Visualized portions appear unremarkable.

Pancreas:  Visualized portions appear unremarkable.Portions of the
pancreas are obscured by bowel gas.

Spleen:  Visualized portions appear unremarkable.

Right Kidney:   The renal cortical thickness and echogenicity are
preserved.  There is no hydronephrosis or focal abnormality. Renal
length is 10.3 cm.

Left Kidney:   The renal cortical thickness and echogenicity are
preserved.  There is no hydronephrosis or focal abnormality. Renal
length is 12.0 cm.

Abdominal aorta:  Atherosclerosis without distension.
IMPRESSION: No acute abdominal findings.  Aortic atherosclerosis.

## 2011-12-02 MED ORDER — CEPHALEXIN 500 MG PO CAPS: 500.0000 mg | ORAL_CAPSULE | Freq: Two times a day (BID) | ORAL | Status: AC

## 2011-12-02 MED ORDER — POTASSIUM CHLORIDE 20 MEQ PO PACK
40.0000 meq | PACK | Freq: Once | ORAL | Status: DC
  Filled 2011-12-02: qty 2

## 2011-12-02 MED ORDER — DEXTROSE 5 % IV SOLN
1.0000 g | Freq: Once | INTRAVENOUS | Status: AC
  Administered 2011-12-02: 1 g via INTRAVENOUS
  Filled 2011-12-02: qty 10

## 2011-12-02 MED ORDER — GI COCKTAIL ~~LOC~~
30.0000 mL | Freq: Once | ORAL | Status: AC
  Administered 2011-12-02: 30 mL via ORAL
  Filled 2011-12-02: qty 30

## 2011-12-02 MED ORDER — POTASSIUM CHLORIDE CRYS ER 20 MEQ PO TBCR
EXTENDED_RELEASE_TABLET | ORAL | Status: AC
  Administered 2011-12-02: 40 meq
  Filled 2011-12-02: qty 2

## 2011-12-02 MED ORDER — LOPERAMIDE HCL 2 MG PO CAPS: 2.0000 mg | ORAL_CAPSULE | Freq: Four times a day (QID) | ORAL | Status: AC | PRN

## 2011-12-02 MED ORDER — ONDANSETRON HCL 4 MG/2ML IJ SOLN
4.0000 mg | Freq: Once | INTRAMUSCULAR | Status: AC
  Administered 2011-12-02: 4 mg via INTRAVENOUS
  Filled 2011-12-02: qty 2

## 2011-12-02 MED ORDER — SODIUM CHLORIDE 0.9 % IV BOLUS (SEPSIS)
1000.0000 mL | Freq: Once | INTRAVENOUS | Status: AC
  Administered 2011-12-02: 1000 mL via INTRAVENOUS

## 2011-12-02 MED ORDER — POTASSIUM CHLORIDE 10 MEQ/100ML IV SOLN
10.0000 meq | INTRAVENOUS | Status: AC
  Administered 2011-12-02 (×2): 10 meq via INTRAVENOUS
  Filled 2011-12-02 (×2): qty 100

## 2011-12-02 MED ORDER — HYDROMORPHONE HCL PF 1 MG/ML IJ SOLN
0.5000 mg | Freq: Once | INTRAMUSCULAR | Status: AC
  Administered 2011-12-02: 0.5 mg via INTRAVENOUS
  Filled 2011-12-02: qty 1

## 2011-12-02 NOTE — ED Notes (Addendum)
Pt states that she has had diarrhea for two days.

## 2011-12-02 NOTE — ED Provider Notes (Signed)
History     CSN: [id number]  Arrival date & time 12/02/11  4098   First MD Initiated Contact with Patient 12/02/11 [phone]      Chief Complaint  Patient presents with  . Diarrhea    Patient is a 61 y.o. female presenting with diarrhea.  Diarrhea The primary symptoms include diarrhea. Primary symptoms do not include vomiting.   The patient presents with nausea and abdominal pain with diarrhea.  She notes that her symptoms began approximately 5 days ago.  Initially she had bilateral mid back pain and diarrhea.  She notes the symptoms began gradually.   After one day of symptoms the patient went to an urgent care center.  She started on Macrobid in spite of an unremarkable urinalysis.  The following day and as her symptoms persisted she went to her urologist.  She had a CT scan done to rule out kidney stone.  This scan per her report was negative. Over the past 3 days her back pain has disappeared, but her diarrhea persists.  She notes ongoing, increasing fatigue, weakness.  She has also developed epigastric and right upper quadrant pain.  This pain is worst postprandially.  No significant alleviating factor.  She notes that as the pain is worse following eating, she also has postprandial diarrhea.  No significant nausea or vomiting.  No fevers, no chills. No dyspnea, no chest pain, no confusion. Past Medical History  Diagnosis Date  . Hyperlipidemia   . GERD (gastroesophageal reflux disease)   . Cancer     Past Surgical History  Procedure Date  . Back surgery   . Foot surgery   . Appendectomy   . Abdominal hysterectomy     Family History  Problem Relation Age of Onset  . Coronary artery disease Father   . Coronary artery disease Mother     History  Substance Use Topics  . Smoking status: Former Games developer  . Smokeless tobacco: Not on file  . Alcohol Use: No    OB History    Grav Para Term Preterm Abortions TAB SAB Ect Mult Living                  Review of Systems    Constitutional:       HPI  HENT:       HPI otherwise negative  Eyes: Negative.   Respiratory:       HPI, otherwise negative  Cardiovascular:       HPI, otherwise nmegative  Gastrointestinal: Positive for diarrhea. Negative for vomiting.  Genitourinary:       HPI, otherwise negative  Musculoskeletal:       HPI, otherwise negative  Skin: Negative.   Neurological: Negative for syncope.    Allergies  Sulfa antibiotics  Home Medications   Current Outpatient Rx  Name Route Sig Dispense Refill  . ATORVASTATIN CALCIUM 80 MG PO TABS Oral Take 1 tablet (80 mg total) by mouth daily. 90 tablet 3  . VITAMIN D 1000 UNITS PO CAPS Oral Take 1,000 Units by mouth daily. OCCASIONALLY     . ESOMEPRAZOLE MAGNESIUM 40 MG PO CPDR Oral Take 1 capsule (40 mg total) by mouth daily. 30 capsule 5  . ESTROGENS CONJUGATED 0.9 MG PO TABS Oral Take 0.9 mg by mouth daily. Take daily for 21 days then do not take for 7 days.     Marland Kitchen LIMBREL PO Oral Take by mouth 2 (two) times daily.      Marland Kitchen HYDROCODONE-ACETAMINOPHEN PO Oral Take  by mouth as needed.      Marland Kitchen LOSARTAN POTASSIUM-HCTZ 100-12.5 MG PO TABS  TAKE 1 TABLET EVERY DAY 90 tablet 3  . ONE-DAILY MULTI VITAMINS PO TABS Oral Take 1 tablet by mouth daily.        BP 136/80  Pulse 77  Temp(Src) 97.5 F (36.4 C) (Oral)  Resp 18  Ht 5\' 9"  (1.753 m)  Wt 160 lb (72.576 kg)  BMI 23.63 kg/m2  SpO2 100%  Physical Exam  Nursing note and vitals reviewed. Constitutional: She is oriented to person, place, and time. She appears well-developed and well-nourished. No distress.  HENT:  Head: Normocephalic and atraumatic.  Eyes: Conjunctivae and EOM are normal.  Cardiovascular: Normal rate and regular rhythm.   Pulmonary/Chest: Effort normal and breath sounds normal. No stridor. No respiratory distress.  Abdominal: Normal appearance. She exhibits no distension. There is tenderness in the right upper quadrant and epigastric area. There is no rigidity, no rebound and no  guarding.  Musculoskeletal: She exhibits no edema.  Neurological: She is alert and oriented to person, place, and time. No cranial nerve deficit.  Skin: Skin is warm and dry.  Psychiatric: She has a normal mood and affect.    ED Course  Procedures (including critical care time)   Labs Reviewed  COMPREHENSIVE METABOLIC PANEL  CBC  DIFFERENTIAL  LIPASE, BLOOD  URINALYSIS, ROUTINE W REFLEX MICROSCOPIC   No results found.   No diagnosis found.   2:59 PM Patient sitting upright tolerating crackers and soda. MDM  This 61 year old female presents with one week of diarrhea.  On exam she is in no distress though she is listless.  The patient is awake, alert, oriented.  Her vital signs are essentially unremarkable.  Patient's labs are most notable for hypokalemia and the suggestion of a urinary tract infection.  Given the lung findings, the patient was started on ceftriaxone and will be discharged with Keflex.  She was previously on and other antibiotics, but has used that medication multiple times in the past.  Following IV fluids, ultrasound exam did not demonstrate new acute pathology, and acknowledging the patient's recent CT that also did not demonstrate acute pathology, she was discharged in stable condition to follow up with her gastroenterologist.  I have discussed all findings and return precautions with the patient and her husband.       Gerhard Munch, MD 12/02/11 1501

## 2011-12-02 NOTE — Discharge Instructions (Signed)
Please make sure to speak with your physician today to ensure appropriate followup care.  If you develop any new, or concerning changes in your condition, please return to the emergency department.

## 2011-12-02 NOTE — ED Notes (Signed)
Patient transported to Ultrasound 

## 2012-01-21 ENCOUNTER — Encounter: Payer: Self-pay | Admitting: Cardiology

## 2012-01-21 ENCOUNTER — Other Ambulatory Visit (HOSPITAL_COMMUNITY): Payer: Self-pay | Admitting: Gastroenterology

## 2012-01-21 ENCOUNTER — Ambulatory Visit (INDEPENDENT_AMBULATORY_CARE_PROVIDER_SITE_OTHER): Payer: 59 | Admitting: Cardiology

## 2012-01-21 ENCOUNTER — Other Ambulatory Visit: Payer: 59

## 2012-01-21 VITALS — BP 120/84 | HR 66 | Ht 69.0 in | Wt 182.0 lb

## 2012-01-21 DIAGNOSIS — E7800 Pure hypercholesterolemia, unspecified: Secondary | ICD-10-CM

## 2012-01-21 DIAGNOSIS — R109 Unspecified abdominal pain: Secondary | ICD-10-CM | POA: Insufficient documentation

## 2012-01-21 DIAGNOSIS — I119 Hypertensive heart disease without heart failure: Secondary | ICD-10-CM

## 2012-01-21 DIAGNOSIS — R1013 Epigastric pain: Secondary | ICD-10-CM

## 2012-01-21 DIAGNOSIS — E785 Hyperlipidemia, unspecified: Secondary | ICD-10-CM

## 2012-01-21 LAB — LIPID PANEL: Cholesterol: 156 mg/dL (ref 0–200)

## 2012-01-21 LAB — BASIC METABOLIC PANEL
BUN: 12 mg/dL (ref 6–23)
CO2: 29 mEq/L (ref 19–32)
Calcium: 9.1 mg/dL (ref 8.4–10.5)
Creatinine, Ser: 0.8 mg/dL (ref 0.4–1.2)

## 2012-01-21 LAB — HEPATIC FUNCTION PANEL
AST: 19 U/L (ref 0–37)
Albumin: 3.9 g/dL (ref 3.5–5.2)
Alkaline Phosphatase: 48 U/L (ref 39–117)
Bilirubin, Direct: 0.1 mg/dL (ref 0.0–0.3)
Total Protein: 6.8 g/dL (ref 6.0–8.3)

## 2012-01-21 LAB — LDL CHOLESTEROL, DIRECT: Direct LDL: 69.8 mg/dL

## 2012-01-21 NOTE — Progress Notes (Signed)
Veronica Mcclain Date of Birth:  12-22-1950 [facility] [address] [place], [place]  [address] [phone]  Fax   [phone]  HPI: This pleasant 61 year old woman is seen for a scheduled four-month followup office visit.  She has a past history of hypercholesterolemia and a history of essential hypertension.  She does not have any history of ischemic heart disease.  She had a normal nuclear stress test in 2005.  He has had a lot of problems with upper abdominal and epigastric discomfort. When we last saw her in January 2013 she was having similar symptoms.  We noted at that time she had a normal ultrasound of her abdomen in 2008 which specifically did not show any gallbladder pathology.  She initially thought that doubling up on her Nexium has helped her symptoms but now has stopped the Nexium because she thought it was making her symptoms worse.  Current Outpatient Prescriptions  Medication Sig Dispense Refill  . atorvastatin (LIPITOR) 80 MG tablet Take 1 tablet (80 mg total) by mouth daily.  90 tablet  3  . Cholecalciferol (VITAMIN D) 1000 UNITS capsule Take 1,000 Units by mouth daily. OCCASIONALLY       . estrogens, conjugated, (PREMARIN) 0.9 MG tablet Take 0.9 mg by mouth daily. Take daily for 21 days then do not take for 7 days.       . Flavocoxid (LIMBREL) 500 MG CAPS Take 1 capsule by mouth 2 (two) times daily.      Marland Kitchen HYDROcodone-acetaminophen (VICODIN) 5-500 MG per tablet Take 1 tablet by mouth every 6 (six) hours as needed.      Marland Kitchen losartan-hydrochlorothiazide (HYZAAR) 100-12.5 MG per tablet TAKE 1 TABLET EVERY DAY  90 tablet  3  . Multiple Vitamin (MULTIVITAMIN) tablet Take 1 tablet by mouth daily.          Allergies  Allergen Reactions  . Sulfa Antibiotics Nausea And Vomiting    Patient Active Problem List  Diagnoses  . Hyperlipidemia  . GERD (gastroesophageal reflux disease)  . Flank pain  . Benign hypertensive heart disease without heart failure      History  Smoking status  . Former Smoker  Smokeless tobacco  . Not on file    History  Alcohol Use No    Family History  Problem Relation Age of Onset  . Coronary artery disease Father   . Coronary artery disease Mother     Review of Systems: The patient denies any heat or cold intolerance.  No weight gain or weight loss.  The patient denies headaches or blurry vision.  There is no cough or sputum production.  The patient denies dizziness.  There is no hematuria or hematochezia.  The patient denies any muscle aches or arthritis.  The patient denies any rash.  The patient denies frequent falling or instability.  There is no history of depression or anxiety.  All other systems were reviewed and are negative.   Physical Exam: Filed Vitals:   01/21/12 1025  BP: 120/84  Pulse: 66   the general appearance reveals a well-developed well-nourished woman in no distress.  She has a deep tan after returning from a two-week [place] vacation.The head and neck exam reveals pupils equal and reactive.  Extraocular movements are full.  There is no scleral icterus.  The mouth and pharynx are normal.  The neck is supple.  The carotids reveal no bruits.  The jugular venous pressure is normal.  The  thyroid is not enlarged.  There is no lymphadenopathy.  The chest is clear to percussion and auscultation.  There are no rales or rhonchi.  Expansion of the chest is symmetrical.  The precordium is quiet.  The first heart sound is normal.  The second heart sound is physiologically split.  There is no murmur gallop rub or click.  There is no abnormal lift or heave.  The abdomen is soft and nontender.  The bowel sounds are accentuated.The liver and spleen are not enlarged.  There are no abdominal masses.  There are no abdominal bruits.  Extremities reveal good pedal pulses.  There is no phlebitis or edema.  There is no cyanosis or clubbing.  Strength is normal and symmetrical in all extremities.  There is no  lateralizing weakness.  There are no sensory deficits.  The skin is warm and dry.  There is no rash.      Assessment / Plan: The patient is to continue same cardiac meds.  She has an appointment to see her gastroenterologist Dr. Matthias Hughs  this afternoon.  Recheck here in 4 months for followup office visit and fasting lab work

## 2012-01-21 NOTE — Patient Instructions (Signed)
Will obtain labs today and call you with the results (lp/bmet/hfp)  Your physician recommends that you continue on your current medications as directed. Please refer to the Current Medication list given to you today.  Your physician recommends that you schedule a follow-up appointment in: 4 months with fasting labs (lp/bmet/hfp)  

## 2012-01-21 NOTE — Assessment & Plan Note (Signed)
The patient describes her abdominal pain as being like a vice across her upper epigastrium.  The pain occurs after eating.  She states that eating TUMS seems to help.  She has had a lot of constipation and no diarrhea.  The pain is not related to exertion.  Of note is the fact that she was seen [facility] in March 2013 and had a ultrasound of her abdomen which was unremarkable and did not show any gallstones.

## 2012-01-21 NOTE — Assessment & Plan Note (Signed)
No symptoms referable to her essential hypertension.  Blood pressure has been maintaining stable.  Her weight is down 3 pounds since last visit.

## 2012-01-21 NOTE — Assessment & Plan Note (Signed)
The patient has a history of hypercholesterolemia.  She is on Lipitor 80 mg daily.  Blood work is pending today.  She is not having any arthralgias or myalgias related to the statin therapy.  She does have a history of arthritis and sees a rheumatologist and is on Limbrel.

## 2012-01-22 NOTE — Progress Notes (Signed)
Quick Note:  Please report to patient. The recent labs are stable. Continue same medication and careful diet.Lipids are better. ______ 

## 2012-01-26 ENCOUNTER — Telehealth: Payer: Self-pay | Admitting: *Deleted

## 2012-01-26 NOTE — Telephone Encounter (Signed)
Mailed copy of labs and left message to call if any questions  

## 2012-01-26 NOTE — Telephone Encounter (Signed)
Message copied by Burnell Blanks on Wed Jan 26, 2012  9:12 AM ------      Message from: Cassell Clement      Created: Sat Jan 22, 2012  5:49 PM       Please report to patient.  The recent labs are stable. Continue same medication and careful diet.Lipids are better.

## 2012-02-04 ENCOUNTER — Encounter (HOSPITAL_COMMUNITY)
Admission: RE | Admit: 2012-02-04 | Discharge: 2012-02-04 | Disposition: A | Discharge status: Home / Self Care | Payer: 59 | Source: Ambulatory Visit | Attending: Gastroenterology | Admitting: Gastroenterology

## 2012-02-04 ENCOUNTER — Other Ambulatory Visit (HOSPITAL_COMMUNITY): Payer: 59

## 2012-02-04 ENCOUNTER — Encounter (HOSPITAL_COMMUNITY): Payer: Self-pay

## 2012-02-04 DIAGNOSIS — R112 Nausea with vomiting, unspecified: Secondary | ICD-10-CM | POA: Insufficient documentation

## 2012-02-04 DIAGNOSIS — R1013 Epigastric pain: Secondary | ICD-10-CM | POA: Insufficient documentation

## 2012-02-04 IMAGING — NM NM HEPATO W/GB/PHARM/[PERSON_NAME]
1 series · 12 of 12 positions shown · non-contrast
Comparison: none

CLINICAL DATA: Epigastric pain, nausea, vomiting.

[Series 1: hepato · 4.46mm/px · 2 acquisitions, 12 frames shown]
[im 1/2]
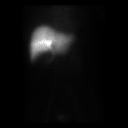
[im 1/2]
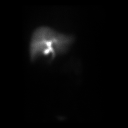
[im 1/2]
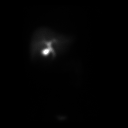
[im 1/2]
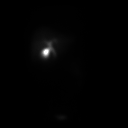
[im 1/2]
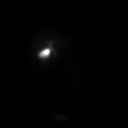
[im 1/2]
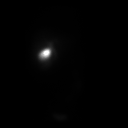
[im 2/2]
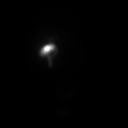
[im 2/2]
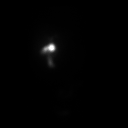
[im 2/2]
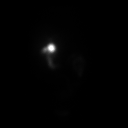
[im 2/2]
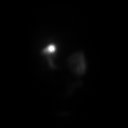
[im 2/2]
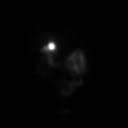
[im 2/2]
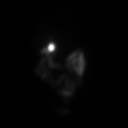

[12 of 12 positions shown; findings below may reference images not displayed]

HEPATOBILIARY SCINTIGRAPHY WITH EJECTION FRACTION

Anterior imaging after0.0m1i 2cGGT Choletec IV. There is prompt
clearance of the radiopharmaceutical from the blood pool. Timely
visualization of activity in central bile ducts, small bowel, and
gallbladder.
After 1 hour,1.6 mcg CCK was infused intravenously and imaging
continued. The patient described pressure with infusion. The
calculated gallbladder ejection fraction over 30 minutes is 67.6%
[(normal >30% at  thirty minutes (Ziessman et al., 2220 J. Nuclear
Med).]

IMPRESSION
1. Patency of cystic and common bile ducts.
2. Normal gallbladder ejection fraction.

## 2012-02-04 MED ORDER — SINCALIDE 5 MCG IJ SOLR: 0.0200 ug/kg | Freq: Once | INTRAMUSCULAR | Status: DC

## 2012-02-04 MED ORDER — TECHNETIUM TC 99M MEBROFENIN IV KIT
5.5000 | PACK | Freq: Once | INTRAVENOUS | Status: AC | PRN
  Administered 2012-02-04: 5.5 via INTRAVENOUS

## 2012-02-05 ENCOUNTER — Other Ambulatory Visit: Payer: Self-pay | Admitting: Cardiology

## 2012-04-19 ENCOUNTER — Other Ambulatory Visit: Payer: Self-pay | Admitting: Gastroenterology

## 2012-05-19 ENCOUNTER — Ambulatory Visit: Payer: 59 | Admitting: Cardiology

## 2012-05-19 ENCOUNTER — Other Ambulatory Visit: Payer: 59

## 2012-06-02 ENCOUNTER — Telehealth: Payer: Self-pay | Admitting: Cardiology

## 2012-06-02 NOTE — Telephone Encounter (Signed)
Pt's insurance won't cover lipitor need to change to something else, pls call pt when done ok to leave message [phone], [facility], pt out needs asap

## 2012-06-05 NOTE — Telephone Encounter (Signed)
Left message to call back  

## 2012-06-12 ENCOUNTER — Telehealth: Payer: Self-pay | Admitting: Cardiology

## 2012-06-12 NOTE — Telephone Encounter (Signed)
Spoke with patient and she did state she does have a lot of joint pain.  Does have arthritis but is wondering if Lipitor may be causing more pain. Has appointment on November 1 and would like to hold and discuss then.  Advised ok and would forward to  Dr. Patty Sermons and if he didn't want her to be off that long would call her back

## 2012-06-12 NOTE — Telephone Encounter (Signed)
Pt calling back re message from 06-02-12 that needed liptior changed, if can change go ahead and call in to [facility] and leave message [phone] that its called in, if you need to talk with her first call [phone]

## 2012-06-12 NOTE — Telephone Encounter (Signed)
Spoke with patient and she did state she does have a lot of joint pain.  Does have arthritis but is wondering if Lipitor may be causing more pain. Has appointment on November 1 and would like to hold and discuss then.  Advised ok and would forward to  Dr. Patty Sermons and if he didn't want her to be off that long would call her back  Will finish documentation in open encounter from 9/30

## 2012-06-12 NOTE — Telephone Encounter (Signed)
Agree with plan 

## 2012-07-14 ENCOUNTER — Telehealth: Payer: Self-pay | Admitting: *Deleted

## 2012-07-14 ENCOUNTER — Other Ambulatory Visit (INDEPENDENT_AMBULATORY_CARE_PROVIDER_SITE_OTHER): Payer: 59

## 2012-07-14 ENCOUNTER — Ambulatory Visit (INDEPENDENT_AMBULATORY_CARE_PROVIDER_SITE_OTHER): Payer: 59 | Admitting: Cardiology

## 2012-07-14 ENCOUNTER — Encounter: Payer: Self-pay | Admitting: Cardiology

## 2012-07-14 VITALS — BP 150/82 | HR 85 | Resp 18 | Ht 69.0 in | Wt 185.8 lb

## 2012-07-14 DIAGNOSIS — E7800 Pure hypercholesterolemia, unspecified: Secondary | ICD-10-CM

## 2012-07-14 DIAGNOSIS — E785 Hyperlipidemia, unspecified: Secondary | ICD-10-CM

## 2012-07-14 DIAGNOSIS — I119 Hypertensive heart disease without heart failure: Secondary | ICD-10-CM

## 2012-07-14 LAB — BASIC METABOLIC PANEL
BUN: 10 mg/dL (ref 6–23)
CO2: 29 mEq/L (ref 19–32)
Calcium: 8.8 mg/dL (ref 8.4–10.5)
Chloride: 103 mEq/L (ref 96–112)
Creatinine, Ser: 0.7 mg/dL (ref 0.4–1.2)
Glucose, Bld: 84 mg/dL (ref 70–99)

## 2012-07-14 LAB — HEPATIC FUNCTION PANEL
AST: 18 U/L (ref 0–37)
Albumin: 3.6 g/dL (ref 3.5–5.2)
Alkaline Phosphatase: 64 U/L (ref 39–117)
Bilirubin, Direct: 0.1 mg/dL (ref 0.0–0.3)

## 2012-07-14 LAB — LIPID PANEL
Cholesterol: 253 mg/dL — ABNORMAL HIGH (ref 0–200)
Total CHOL/HDL Ratio: 5

## 2012-07-14 MED ORDER — ATORVASTATIN CALCIUM 80 MG PO TABS: 80.0000 mg | ORAL_TABLET | Freq: Every day | ORAL | Status: DC

## 2012-07-14 NOTE — Assessment & Plan Note (Signed)
We are checking her lipids today.  She has been off her Lipitor for a while and she expects that today's readings will be higher than usual.

## 2012-07-14 NOTE — Progress Notes (Signed)
Veronica Mcclain Date of Birth:  [birth year] [facility] [address] [place], [place]  [address] [phone]  Fax   [phone]  HPI: This pleasant 61 year old woman is seen for a scheduled four-month followup office visit. She has a past history of hypercholesterolemia and a history of essential hypertension. She does not have any history of ischemic heart disease. She had a normal nuclear stress test in 2005. He has had a lot of problems with upper abdominal and epigastric discomfort. When we last saw her in January 2013 she was having similar symptoms. We noted at that time she had a normal ultrasound of her abdomen in 2008 which specifically did not show any gallbladder pathology.  Recently she has been forgetting to take her losartan.  She has also been off her ursodiol recently.  She was having difficulty getting her prescription for Lipitor refill from her health insurance company and so she has been out of Lipitor for a while as well.   Current Outpatient Prescriptions  Medication Sig Dispense Refill  . Cholecalciferol (VITAMIN D) 1000 UNITS capsule Take 1,000 Units by mouth daily. OCCASIONALLY       . DEXILANT 60 MG capsule Take 60 mg by mouth daily.       Marland Kitchen estrogens, conjugated, (PREMARIN) 0.9 MG tablet Take 0.9 mg by mouth daily. Take daily for 21 days then do not take for 7 days.       Marland Kitchen HYDROcodone-acetaminophen (VICODIN) 5-500 MG per tablet Take 1 tablet by mouth every 6 (six) hours as needed.      Marland Kitchen losartan-hydrochlorothiazide (HYZAAR) 100-12.5 MG per tablet TAKE 1 TABLET EVERY DAY  90 tablet  2  . Multiple Vitamin (MULTIVITAMIN) tablet Take 1 tablet by mouth daily.        . ursodiol (ACTIGALL) 300 MG capsule Take 300 mg by mouth 2 (two) times daily.       Marland Kitchen atorvastatin (LIPITOR) 80 MG tablet Take 1 tablet (80 mg total) by mouth daily.  90 tablet  3  . DISCONTD: atorvastatin (LIPITOR) 80 MG tablet Take 1 tablet (80 mg total) by mouth daily.  90 tablet  3     Allergies  Allergen Reactions  . Sulfa Antibiotics Nausea And Vomiting    Patient Active Problem List  Diagnosis  . Hyperlipidemia  . GERD (gastroesophageal reflux disease)  . Flank pain  . Benign hypertensive heart disease without heart failure  . Abdominal pain    History  Smoking status  . Former Smoker  Smokeless tobacco  . Not on file    History  Alcohol Use No    Family History  Problem Relation Age of Onset  . Coronary artery disease Father   . Coronary artery disease Mother     Review of Systems: The patient denies any heat or cold intolerance.  No weight gain or weight loss.  The patient denies headaches or blurry vision.  There is no cough or sputum production.  The patient denies dizziness.  There is no hematuria or hematochezia.  The patient denies any muscle aches or arthritis.  The patient denies any rash.  The patient denies frequent falling or instability.  There is no history of depression or anxiety.  All other systems were reviewed and are negative.   Physical Exam: Filed Vitals:   07/14/12 1056  BP: 150/82  Pulse: 85  Resp: 18   the general appearance reveals a well-developed well-nourished woman in no distress.The head and neck exam reveals  pupils equal and reactive.  Extraocular movements are full.  There is no scleral icterus.  The mouth and pharynx are normal.  The neck is supple.  The carotids reveal no bruits.  The jugular venous pressure is normal.  The  thyroid is not enlarged.  There is no lymphadenopathy.  The chest is clear to percussion and auscultation.  There are no rales or rhonchi.  Expansion of the chest is symmetrical.  The precordium is quiet.  The first heart sound is normal.  The second heart sound is physiologically split.  There is no murmur gallop rub or click.  There is no abnormal lift or heave.  The abdomen is soft and nontender.  The bowel sounds are normal.  The liver and spleen are not enlarged.  There are no abdominal  masses.  There are no abdominal bruits.  Extremities reveal good pedal pulses.  There is no phlebitis or edema.  There is no cyanosis or clubbing.  Strength is normal and symmetrical in all extremities.  There is no lateralizing weakness.  There are no sensory deficits.  The skin is warm and dry.  There is no rash.  She complains of her feet feeling cold but she has excellent pedal pulses    Assessment / Plan: Await results of today's lab work.  Recheck in 4 months for followup office visit lipid panel hepatic function panel and basal metabolic panel.  We sent her in a new prescription for generic Lipitor 80 mg daily

## 2012-07-14 NOTE — Patient Instructions (Addendum)
Your physician recommends that you continue on your current medications as directed. Please refer to the Current Medication list given to you today.  Your physician recommends that you schedule a follow-up appointment in: 4 months with fasting labs (lp/bmet/hfp)   Will obtain labs today and call you with the results (lp./bmet/hfp) 

## 2012-07-14 NOTE — Telephone Encounter (Signed)
Advised patient of lab results  

## 2012-07-14 NOTE — Telephone Encounter (Signed)
Message copied by Burnell Blanks on Fri Jul 14, 2012  3:40 PM ------      Message from: Cassell Clement      Created: Fri Jul 14, 2012  3:29 PM       Cholesterol and triglycerides are much higher.  This shows that she needs to go back on her Lipitor as prescribed.

## 2012-07-14 NOTE — Assessment & Plan Note (Signed)
The patient has not been experiencing any chest pain or shortness of breath. 

## 2012-11-17 ENCOUNTER — Ambulatory Visit: Payer: 59 | Admitting: Cardiology

## 2012-11-17 ENCOUNTER — Other Ambulatory Visit: Payer: 59

## 2012-11-30 ENCOUNTER — Telehealth: Payer: Self-pay | Admitting: *Deleted

## 2012-11-30 ENCOUNTER — Other Ambulatory Visit (INDEPENDENT_AMBULATORY_CARE_PROVIDER_SITE_OTHER): Payer: 59

## 2012-11-30 LAB — LIPID PANEL
Cholesterol: 207 mg/dL — ABNORMAL HIGH (ref 0–200)
HDL: 44.9 mg/dL (ref >39.00)
Triglycerides: 405 mg/dL — ABNORMAL HIGH (ref 0.0–149.0)
VLDL: 81 mg/dL — ABNORMAL HIGH (ref 0.0–40.0)

## 2012-11-30 LAB — BASIC METABOLIC PANEL
BUN: 16 mg/dL (ref 6–23)
CO2: 29 mEq/L (ref 19–32)
Calcium: 8.7 mg/dL (ref 8.4–10.5)
Creatinine, Ser: 0.8 mg/dL (ref 0.4–1.2)
GFR: 81.02 mL/min (ref >60.00)
Glucose, Bld: 92 mg/dL (ref 70–99)
Sodium: 137 mEq/L (ref 135–145)

## 2012-11-30 LAB — HEPATIC FUNCTION PANEL
Albumin: 3.7 g/dL (ref 3.5–5.2)
Total Protein: 6.7 g/dL (ref 6.0–8.3)

## 2012-11-30 MED ORDER — POTASSIUM CHLORIDE CRYS ER 20 MEQ PO TBCR: EXTENDED_RELEASE_TABLET | ORAL | Status: DC

## 2012-11-30 NOTE — Telephone Encounter (Signed)
Discussed with Dr Daleen Squibb and will have patient start Kdur 20 meq 2 today and then daily. Recheck labs at ov next week. Advised patient, sent to pharmacy.

## 2012-12-04 NOTE — Progress Notes (Signed)
Quick Note:  Please make copy of labs for patient visit. ______ 

## 2012-12-08 ENCOUNTER — Other Ambulatory Visit (INDEPENDENT_AMBULATORY_CARE_PROVIDER_SITE_OTHER): Payer: 59

## 2012-12-08 ENCOUNTER — Other Ambulatory Visit: Payer: 59

## 2012-12-08 ENCOUNTER — Encounter: Payer: Self-pay | Admitting: Cardiology

## 2012-12-08 ENCOUNTER — Ambulatory Visit (INDEPENDENT_AMBULATORY_CARE_PROVIDER_SITE_OTHER): Payer: 59 | Admitting: Cardiology

## 2012-12-08 VITALS — BP 132/68 | HR 81 | Ht 69.5 in | Wt 181.1 lb

## 2012-12-08 DIAGNOSIS — E785 Hyperlipidemia, unspecified: Secondary | ICD-10-CM

## 2012-12-08 DIAGNOSIS — E876 Hypokalemia: Secondary | ICD-10-CM

## 2012-12-08 DIAGNOSIS — E7800 Pure hypercholesterolemia, unspecified: Secondary | ICD-10-CM

## 2012-12-08 DIAGNOSIS — I119 Hypertensive heart disease without heart failure: Secondary | ICD-10-CM

## 2012-12-08 DIAGNOSIS — K219 Gastro-esophageal reflux disease without esophagitis: Secondary | ICD-10-CM

## 2012-12-08 LAB — BASIC METABOLIC PANEL
BUN: 11 mg/dL (ref 6–23)
CO2: 30 mEq/L (ref 19–32)
Calcium: 9 mg/dL (ref 8.4–10.5)
GFR: 84.82 mL/min (ref >60.00)
Glucose, Bld: 87 mg/dL (ref 70–99)
Sodium: 137 mEq/L (ref 135–145)

## 2012-12-08 NOTE — Progress Notes (Signed)
Seward Meth Rossano Date of Birth:  [birth year] [facility] [address] [place], [place]  [address] [phone]  Fax   [phone]  HPI: This pleasant 62 year old woman is seen for a scheduled four-month followup office visit. She has a past history of hypercholesterolemia and a history of essential hypertension. She does not have any history of ischemic heart disease. She had a normal nuclear stress test in 2005. He has had a lot of problems with upper abdominal and epigastric discomfort. When we last saw her in January 2013 she was having similar symptoms. We noted at that time she had a normal ultrasound of her abdomen in 2008 which specifically did not show any gallbladder pathology.  She has an appointment with her gastroenterologist Dr. Matthias Hughs coming up soon.   Current Outpatient Prescriptions  Medication Sig Dispense Refill  . atorvastatin (LIPITOR) 80 MG tablet Take 1 tablet (80 mg total) by mouth daily.  90 tablet  3  . Cholecalciferol (VITAMIN D) 1000 UNITS capsule Take 1,000 Units by mouth daily. OCCASIONALLY       . DEXILANT 60 MG capsule Take 60 mg by mouth daily.       Marland Kitchen estrogens, conjugated, (PREMARIN) 0.9 MG tablet Take 0.9 mg by mouth daily.       Marland Kitchen HYDROcodone-acetaminophen (VICODIN) 5-500 MG per tablet Take 1 tablet by mouth every 6 (six) hours as needed.      Marland Kitchen losartan-hydrochlorothiazide (HYZAAR) 100-12.5 MG per tablet TAKE 1 TABLET EVERY DAY  90 tablet  2  . Multiple Vitamin (MULTIVITAMIN) tablet Take 1 tablet by mouth daily.        . potassium chloride SA (K-DUR,KLOR-CON) 20 MEQ tablet 2 tablet today and then 1 daily  31 tablet  5  . ursodiol (ACTIGALL) 300 MG capsule Take 300 mg by mouth 2 (two) times daily.        No current facility-administered medications for this visit.    Allergies  Allergen Reactions  . Sulfa Antibiotics Nausea And Vomiting    Patient Active Problem List  Diagnosis  . Hyperlipidemia  . GERD (gastroesophageal  reflux disease)  . Flank pain  . Benign hypertensive heart disease without heart failure  . Abdominal pain    History  Smoking status  . Former Smoker  Smokeless tobacco  . Not on file    History  Alcohol Use No    Family History  Problem Relation Age of Onset  . Coronary artery disease Father   . Coronary artery disease Mother     Review of Systems: The patient denies any heat or cold intolerance.  No weight gain or weight loss.  The patient denies headaches or blurry vision.  There is no cough or sputum production.  The patient denies dizziness.  There is no hematuria or hematochezia.  The patient denies any muscle aches or arthritis.  The patient denies any rash.  The patient denies frequent falling or instability.  There is no history of depression or anxiety.  All other systems were reviewed and are negative.   Physical Exam: Filed Vitals:   12/08/12 1106  BP: 132/68  Pulse: 81   the general appearance reveals a well-developed well-nourished woman in no distress.The head and neck exam reveals pupils equal and reactive.  Extraocular movements are full.  There is no scleral icterus.  The mouth and pharynx are normal.  The neck is supple.  The carotids reveal no bruits.  The jugular venous pressure is normal.  The  thyroid is not enlarged.  There is no lymphadenopathy.  The chest is clear to percussion and auscultation.  There are no rales or rhonchi.  Expansion of the chest is symmetrical.  The precordium is quiet.  The first heart sound is normal.  The second heart sound is physiologically split.  There is no murmur gallop rub or click.  There is no abnormal lift or heave.  The abdomen is soft and nontender.  The bowel sounds are normal.  The liver and spleen are not enlarged.  There are no abdominal masses.  There are no abdominal bruits.  Extremities reveal good pedal pulses.  There is no phlebitis or edema.  There is no cyanosis or clubbing.  Strength is normal and symmetrical  in all extremities.  There is no lateralizing weakness.  There are no sensory deficits.  The skin is warm and dry.  There is no rash.      Assessment / Plan:  Continue same medication.  Stop drinking cola drinks because of  effect on triglycerides. Recheck in 4 months for followup office visit lipid panel hepatic function panel and BMET

## 2012-12-08 NOTE — Assessment & Plan Note (Signed)
The patient has been experiencing increasing dyspepsia.  She states that she has to take about 8 Tums after each meal now.

## 2012-12-08 NOTE — Patient Instructions (Addendum)
Your physician recommends that you continue on your current medications as directed. Please refer to the Current Medication list given to you today.  Your physician wants you to follow-up in: 4 months with fasting labs (lp/bmet/hfp) You will receive a reminder letter in the mail two months in advance. If you don't receive a letter, please call our office to schedule the follow-up appointment.  

## 2012-12-08 NOTE — Assessment & Plan Note (Signed)
Her blood pressure has been staying stable on current therapy.  Her potassium has been borderline low and she will increase her high potassium foods.

## 2012-12-08 NOTE — Assessment & Plan Note (Signed)
The patient has a history of hypercholesterolemia and is on Lipitor.  Her most recent lipid status has improved

## 2012-12-09 NOTE — Progress Notes (Signed)
Quick Note:  Please report to patient. The recent labs are stable. Continue same medication and careful diet. The potassium is normal now. ______

## 2012-12-11 ENCOUNTER — Telehealth: Payer: Self-pay | Admitting: *Deleted

## 2012-12-11 NOTE — Telephone Encounter (Signed)
Advised patient of lab results  

## 2012-12-11 NOTE — Telephone Encounter (Signed)
Message copied by Burnell Blanks on Mon Dec 11, 2012  9:09 AM ------      Message from: Cassell Clement      Created: Sat Dec 09, 2012  4:47 PM       Please report to patient.  The recent labs are stable. Continue same medication and careful diet.  The potassium is normal now. ------

## 2013-02-26 ENCOUNTER — Other Ambulatory Visit: Payer: Self-pay | Admitting: Gynecology

## 2013-03-07 ENCOUNTER — Other Ambulatory Visit: Payer: Self-pay | Admitting: Cardiology

## 2013-03-11 ENCOUNTER — Ambulatory Visit (INDEPENDENT_AMBULATORY_CARE_PROVIDER_SITE_OTHER): Payer: 59 | Admitting: Family Medicine

## 2013-03-11 VITALS — BP 146/82 | HR 79 | Temp 98.1°F | Resp 16 | Ht 67.8 in | Wt 184.2 lb

## 2013-03-11 DIAGNOSIS — R109 Unspecified abdominal pain: Secondary | ICD-10-CM

## 2013-03-11 DIAGNOSIS — M545 Low back pain: Secondary | ICD-10-CM

## 2013-03-11 LAB — POCT UA - MICROSCOPIC ONLY
Casts, Ur, LPF, POC: NEGATIVE
Epithelial cells, urine per micros: NEGATIVE
Mucus, UA: NEGATIVE
Yeast, UA: NEGATIVE

## 2013-03-11 LAB — POCT URINALYSIS DIPSTICK
Ketones, UA: NEGATIVE
Protein, UA: NEGATIVE
Spec Grav, UA: 1.02
pH, UA: 7.5

## 2013-03-11 MED ORDER — CYCLOBENZAPRINE HCL 5 MG PO TABS: 5.0000 mg | ORAL_TABLET | Freq: Three times a day (TID) | ORAL | Status: DC | PRN

## 2013-03-11 NOTE — Progress Notes (Signed)
[address]   [place], [place]  [address]   [phone]  Subjective:    Patient ID: Veronica Mcclain, female    DOB: [birth year], 62 y.o.   MRN: [id number]  HPI This 62 y.o. female presents for evaluation of flank pain.  Having R lower back pain; onset yesterday.  Really achy in lower back.  Sometimes with back ache, will have UTI.  No dysuria; went to the gynecologist two weeks ago; after pap smear, did have dysuria for a few days.  Maybe urinary frequency; no fever/chills/sweats.  No hematuria.  Nocturia x 2 which is baseline.  No nausea or vomiting.  Not sure what makes worse.  No radiation into legs.  Possible kidney stone in past.  No n/t.  No rash.  No constipation or diarrhea; maybe mild constipation. No abdominal pain.  Review of Systems  Constitutional: Negative for fever, chills, diaphoresis and fatigue.  Gastrointestinal: Positive for constipation. Negative for nausea, vomiting, abdominal pain, diarrhea and abdominal distention.  Genitourinary: Positive for flank pain. Negative for dysuria, urgency, frequency, hematuria, vaginal discharge, genital sores and pelvic pain.  Musculoskeletal: Positive for myalgias and back pain. Negative for joint swelling, arthralgias and gait problem.  Skin: Negative for rash.  Neurological: Negative for weakness and numbness.       Objective:   Physical Exam  Nursing note and vitals reviewed. Constitutional: She is oriented to person, place, and time. She appears well-developed and well-nourished. No distress.  HENT:  Head: Normocephalic and atraumatic.  Cardiovascular: Normal rate, regular rhythm and normal heart sounds.   Pulmonary/Chest: Effort normal and breath sounds normal.  Abdominal: Soft. Bowel sounds are normal. She exhibits no distension and no mass. There is no tenderness. There is no rebound and no guarding.  Musculoskeletal:       Lumbar back: She exhibits decreased range of motion, tenderness and pain. She exhibits no bony  tenderness and no spasm.  LUMBAR: NO MIDLINE TTP; +PARASPINAL TTP; STRAIGHT LEG RAISES NEGATIVE; MOTOR 5/5 BLE; TOE AND HEEL WALKING INTACT; MARCHING INTACT.  Neurological: She is alert and oriented to person, place, and time. No cranial nerve deficit. She exhibits normal muscle tone. Coordination normal.  Skin: Skin is warm and dry. She is not diaphoretic.  Psychiatric: She has a normal mood and affect. Her behavior is normal.      Results for orders placed in visit on 03/11/13  POCT URINALYSIS DIPSTICK      Result Value Range   Color, UA yellow     Clarity, UA clear     Glucose, UA neg     Bilirubin, UA neg     Ketones, UA neg     Spec Grav, UA 1.020     Blood, UA trace-intact     pH, UA 7.5     Protein, UA neg     Urobilinogen, UA 1.0     Nitrite, UA neg     Leukocytes, UA Negative    POCT UA - MICROSCOPIC ONLY      Result Value Range   WBC, Ur, HPF, POC 0-1     RBC, urine, microscopic neg     Bacteria, U Microscopic trace     Mucus, UA neg     Epithelial cells, urine per micros neg     Crystals, Ur, HPF, POC neg     Casts, Ur, LPF, POC neg     Yeast, UA neg      Assessment & Plan:  Flank pain -  Plan: POCT urinalysis dipstick, POCT UA - Microscopic Only, Urine culture  Lower back pain - Plan: cyclobenzaprine (FLEXERIL) 5 MG tablet  1. Flank pain: New.  Normal u/a; agreeable to sending Urine culture. 2.  Lower back pain/strain:  New.  Pt declined lumbar spine films.  Recommend rest, heat, stretches, frequent ambulation.  Recommend scheduled NSAIDs for five days; rx for Flexeril provided to take qhs.  If no improvement in two weeks, RTC for xrays.  Meds ordered this encounter  Medications  . cyclobenzaprine (FLEXERIL) 5 MG tablet    Sig: Take 1 tablet (5 mg total) by mouth 3 (three) times daily as needed for muscle spasms.    Dispense:  30 tablet    Refill:  0

## 2013-04-06 ENCOUNTER — Ambulatory Visit: Payer: 59 | Admitting: Cardiology

## 2013-05-04 ENCOUNTER — Ambulatory Visit (INDEPENDENT_AMBULATORY_CARE_PROVIDER_SITE_OTHER): Payer: 59 | Admitting: Cardiology

## 2013-05-04 ENCOUNTER — Other Ambulatory Visit (INDEPENDENT_AMBULATORY_CARE_PROVIDER_SITE_OTHER): Payer: 59

## 2013-05-04 ENCOUNTER — Encounter: Payer: Self-pay | Admitting: Cardiology

## 2013-05-04 VITALS — BP 148/80 | HR 80 | Ht 67.8 in | Wt 184.0 lb

## 2013-05-04 DIAGNOSIS — E785 Hyperlipidemia, unspecified: Secondary | ICD-10-CM

## 2013-05-04 DIAGNOSIS — K219 Gastro-esophageal reflux disease without esophagitis: Secondary | ICD-10-CM

## 2013-05-04 DIAGNOSIS — I119 Hypertensive heart disease without heart failure: Secondary | ICD-10-CM

## 2013-05-04 LAB — BASIC METABOLIC PANEL
CO2: 27 mEq/L (ref 19–32)
Chloride: 107 mEq/L (ref 96–112)
Potassium: 4.1 mEq/L (ref 3.5–5.1)
Sodium: 138 mEq/L (ref 135–145)

## 2013-05-04 LAB — LIPID PANEL
Total CHOL/HDL Ratio: 4
Triglycerides: 263 mg/dL — ABNORMAL HIGH (ref 0.0–149.0)

## 2013-05-04 LAB — HEPATIC FUNCTION PANEL
Albumin: 3.9 g/dL (ref 3.5–5.2)
Alkaline Phosphatase: 64 U/L (ref 39–117)
Total Protein: 6.9 g/dL (ref 6.0–8.3)

## 2013-05-04 LAB — LDL CHOLESTEROL, DIRECT: Direct LDL: 82.4 mg/dL

## 2013-05-04 NOTE — Assessment & Plan Note (Signed)
The patient has a history of essential hypertension.  Recently we had to add potassium supplementation because of hypokalemia she has been taking potassium chloride 20 mEq daily and we are rechecking labs today

## 2013-05-04 NOTE — Progress Notes (Signed)
Seward Meth Volkman Date of Birth:  1951/06/15 [facility] [address] [place], [place]  [address] [phone]         Fax   [phone]  History of Present Illness: This pleasant 62 year old woman is seen for a scheduled four-month followup office visit. She has a past history of hypercholesterolemia and a history of essential hypertension. She does not have any history of ischemic heart disease. She had a normal nuclear stress test in 2005. He has had a lot of problems with upper abdominal and epigastric discomfort. When we last saw her in January 2013 she was having similar symptoms. We noted at that time she had a normal ultrasound of her abdomen in 2008 which specifically did not show any gallbladder pathology.  She presently takes omeprazole-bicarbonate 40/1100 one capsule at bedtime.  She does not rest well at night because of her osteoarthritis.  Her rheumatologist tried her on some nortriptyline but this did not help.   Current Outpatient Prescriptions  Medication Sig Dispense Refill  . atorvastatin (LIPITOR) 80 MG tablet Take 1 tablet (80 mg total) by mouth daily.  90 tablet  3  . Cholecalciferol (VITAMIN D) 1000 UNITS capsule Take 1,000 Units by mouth daily. OCCASIONALLY       . cyclobenzaprine (FLEXERIL) 5 MG tablet Take 1 tablet (5 mg total) by mouth 3 (three) times daily as needed for muscle spasms.  30 tablet  0  . diclofenac (VOLTAREN) 75 MG EC tablet Take 75 mg by mouth 2 (two) times daily.      Marland Kitchen estrogens, conjugated, (PREMARIN) 0.9 MG tablet Take 0.625 mg by mouth daily.       Marland Kitchen HYDROcodone-acetaminophen (VICODIN) 5-500 MG per tablet Take 1 tablet by mouth every 6 (six) hours as needed.      Marland Kitchen losartan-hydrochlorothiazide (HYZAAR) 100-12.5 MG per tablet TAKE 1 TABLET EVERY DAY  90 tablet  2  . omeprazole-sodium bicarbonate (ZEGERID) 40-1100 MG per capsule Take 1 capsule by mouth daily before breakfast.      . potassium chloride SA (K-DUR,KLOR-CON) 20 MEQ  tablet Take 20 mEq by mouth daily.        No current facility-administered medications for this visit.    Allergies  Allergen Reactions  . Sulfa Antibiotics Nausea And Vomiting    Patient Active Problem List   Diagnosis Date Noted  . Hyperlipidemia     Priority: High  . Flank pain 12/04/2010    Priority: Medium  . Abdominal pain 01/21/2012  . Benign hypertensive heart disease without heart failure 04/23/2011  . GERD (gastroesophageal reflux disease)     History  Smoking status  . Former Smoker  Smokeless tobacco  . Not on file    History  Alcohol Use No    Family History  Problem Relation Age of Onset  . Coronary artery disease Father   . Coronary artery disease Mother   . Pancreatic cancer Mother     Review of Systems: Constitutional: no fever chills diaphoresis or fatigue or change in weight.  Head and neck: no hearing loss, no epistaxis, no photophobia or visual disturbance. Respiratory: No cough, shortness of breath or wheezing. Cardiovascular: No chest pain peripheral edema, palpitations. Gastrointestinal: No abdominal distention, no abdominal pain, no change in bowel habits hematochezia or melena. Genitourinary: No dysuria, no frequency, no urgency, no nocturia. Musculoskeletal:No arthralgias, no back pain, no gait disturbance or myalgias. Neurological: No dizziness, no headaches, no numbness, no seizures, no syncope, no weakness, no tremors. Hematologic:  No lymphadenopathy, no easy bruising. Psychiatric: No confusion, no hallucinations, no sleep disturbance.    Physical Exam: Filed Vitals:   05/04/13 1058  BP: 148/80  Pulse: 80   the general appearance reveals a well-developed well-nourished woman in no distress.  Blood pressure higher today but she did not take her morning medicines.The head and neck exam reveals pupils equal and reactive.  Extraocular movements are full.  There is no scleral icterus.  The mouth and pharynx are normal.  The neck is  supple.  The carotids reveal no bruits.  The jugular venous pressure is normal.  The  thyroid is not enlarged.  There is no lymphadenopathy.  The chest is clear to percussion and auscultation.  There are no rales or rhonchi.  Expansion of the chest is symmetrical.  The precordium is quiet.  The first heart sound is normal.  The second heart sound is physiologically split.  There is no murmur gallop rub or click.  There is no abnormal lift or heave.  The abdomen is soft and nontender.  The bowel sounds are normal.  The liver and spleen are not enlarged.  There are no abdominal masses.  There are no abdominal bruits.  Extremities reveal good pedal pulses.  There is no phlebitis or edema.  There is no cyanosis or clubbing.  Strength is normal and symmetrical in all extremities.  There is no lateralizing weakness.  There are no sensory deficits.  The skin is warm and dry.  There is no rash.     Assessment / Plan: Continue same medication.  Lab work today pending.  Recheck 4 months for office visit EKG lipid panel hepatic function panel and basal metabolic panel

## 2013-05-04 NOTE — Patient Instructions (Addendum)
Your physician recommends that you continue on your current medications as directed. Please refer to the Current Medication list given to you today.  Your physician recommends that you return for lab work in: 4 months , LP, HFP, BMET  Your physician wants you to follow-up in: 4 months with Dr. Patty Sermons. You will receive a reminder letter in the mail two months in advance. If you don't receive a letter, please call our office to schedule the follow-up appointment. EKG

## 2013-05-04 NOTE — Assessment & Plan Note (Signed)
Patient has a history of hyperlipidemia.  We are checking fasting lab work today.  She is on Lipitor 80 mg daily is not having any myalgias.

## 2013-05-04 NOTE — Assessment & Plan Note (Signed)
The patient's symptoms are quiescent at this time on her current regimen of omeprazole/bicarbonate 1 capsule at bedtime

## 2013-05-06 NOTE — Progress Notes (Signed)
Quick Note:  Please report to patient. The recent labs are stable. Continue same medication and careful diet. Lipids are better. TG are also better. ______

## 2013-05-07 ENCOUNTER — Telehealth: Payer: Self-pay | Admitting: *Deleted

## 2013-05-07 NOTE — Telephone Encounter (Signed)
Mailed copy of labs and left message to call if any questions  

## 2013-05-07 NOTE — Telephone Encounter (Signed)
Message copied by Burnell Blanks on Mon May 07, 2013 11:39 AM ------      Message from: Cassell Clement      Created: Sun May 06, 2013  8:14 PM       Please report to patient.  The recent labs are stable. Continue same medication and careful diet. Lipids are better. TG are also better. ------

## 2013-05-09 ENCOUNTER — Encounter: Payer: Self-pay | Admitting: Cardiology

## 2013-05-09 ENCOUNTER — Other Ambulatory Visit: Payer: Self-pay | Admitting: Family Medicine

## 2013-05-09 DIAGNOSIS — M545 Low back pain: Secondary | ICD-10-CM

## 2013-05-17 ENCOUNTER — Other Ambulatory Visit: Payer: Self-pay | Admitting: *Deleted

## 2013-05-17 DIAGNOSIS — M545 Low back pain: Secondary | ICD-10-CM

## 2013-05-17 MED ORDER — CYCLOBENZAPRINE HCL 5 MG PO TABS: 5.0000 mg | ORAL_TABLET | Freq: Three times a day (TID) | ORAL | Status: DC | PRN

## 2013-06-14 ENCOUNTER — Other Ambulatory Visit: Payer: Self-pay | Admitting: Cardiology

## 2013-06-14 MED ORDER — POTASSIUM CHLORIDE CRYS ER 20 MEQ PO TBCR: 20.0000 meq | EXTENDED_RELEASE_TABLET | Freq: Every day | ORAL | Status: DC

## 2013-07-19 ENCOUNTER — Other Ambulatory Visit: Payer: Self-pay

## 2013-08-06 ENCOUNTER — Telehealth: Payer: Self-pay | Admitting: Cardiology

## 2013-08-06 NOTE — Telephone Encounter (Signed)
Medication  Patient is having trouble with her neck, patient would like for Dr Patty Sermons to refill steroid dose pac, she uses [facility] [phone]. Please call patient and advise.

## 2013-08-31 ENCOUNTER — Ambulatory Visit (INDEPENDENT_AMBULATORY_CARE_PROVIDER_SITE_OTHER): Payer: 59 | Admitting: Cardiology

## 2013-08-31 ENCOUNTER — Other Ambulatory Visit: Payer: 59

## 2013-08-31 ENCOUNTER — Encounter: Payer: Self-pay | Admitting: Cardiology

## 2013-08-31 VITALS — BP 125/79 | HR 72 | Ht 69.0 in | Wt 183.0 lb

## 2013-08-31 DIAGNOSIS — I119 Hypertensive heart disease without heart failure: Secondary | ICD-10-CM

## 2013-08-31 DIAGNOSIS — K219 Gastro-esophageal reflux disease without esophagitis: Secondary | ICD-10-CM

## 2013-08-31 DIAGNOSIS — E7800 Pure hypercholesterolemia, unspecified: Secondary | ICD-10-CM

## 2013-08-31 DIAGNOSIS — E785 Hyperlipidemia, unspecified: Secondary | ICD-10-CM

## 2013-08-31 LAB — HEPATIC FUNCTION PANEL
AST: 23 U/L (ref 0–37)
Albumin: 4.2 g/dL (ref 3.5–5.2)
Alkaline Phosphatase: 59 U/L (ref 39–117)
Bilirubin, Direct: 0.1 mg/dL (ref 0.0–0.3)
Total Bilirubin: 0.8 mg/dL (ref 0.3–1.2)

## 2013-08-31 LAB — BASIC METABOLIC PANEL
BUN: 13 mg/dL (ref 6–23)
CO2: 28 mEq/L (ref 19–32)
Calcium: 9.2 mg/dL (ref 8.4–10.5)
Chloride: 104 mEq/L (ref 96–112)
Creatinine, Ser: 0.7 mg/dL (ref 0.4–1.2)
Glucose, Bld: 96 mg/dL (ref 70–99)

## 2013-08-31 LAB — LIPID PANEL
Total CHOL/HDL Ratio: 4
Triglycerides: 272 mg/dL — ABNORMAL HIGH (ref 0.0–149.0)

## 2013-08-31 LAB — LDL CHOLESTEROL, DIRECT: Direct LDL: 95.9 mg/dL
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
## 2013-08-31 NOTE — Assessment & Plan Note (Signed)
The patient has not been having any exertional chest pain.  No palpitations.  No symptoms of CHF

## 2013-08-31 NOTE — Addendum Note (Signed)
Addended by: Williemae Area on: 08/31/2013 10:31 AM   Modules accepted: Orders

## 2013-08-31 NOTE — Assessment & Plan Note (Signed)
The patient's symptoms of GERD have improved and she is taking sodium bicarbonate with clinical improvement.

## 2013-08-31 NOTE — Assessment & Plan Note (Signed)
Patient has a history of hypercholesterolemia and is on Lipitor.  She is not having any myalgias.  Blood work today is pending

## 2013-08-31 NOTE — Progress Notes (Signed)
Quick Note:  Please report to patient. The recent labs are stable. Continue same medication and careful diet. ______ 

## 2013-08-31 NOTE — Patient Instructions (Signed)

## 2013-09-03 NOTE — Telephone Encounter (Signed)
Mailed copy of labs and left message to call if any questions  

## 2013-09-03 NOTE — Telephone Encounter (Signed)
Message copied by Burnell Blanks on Mon Sep 03, 2013  5:27 PM ------      Message from: Cassell Clement      Created: Fri Aug 31, 2013  9:12 PM       Please report to patient.  The recent labs are stable. Continue same medication and careful diet. ------

## 2013-09-04 ENCOUNTER — Other Ambulatory Visit: Payer: Self-pay | Admitting: Dermatology

## 2013-09-14 ENCOUNTER — Other Ambulatory Visit: Payer: Self-pay | Admitting: Cardiology

## 2013-10-22 ENCOUNTER — Other Ambulatory Visit: Payer: Self-pay | Admitting: Cardiology

## 2014-01-09 ENCOUNTER — Other Ambulatory Visit: Payer: 59

## 2014-01-16 ENCOUNTER — Other Ambulatory Visit: Payer: 59

## 2014-01-16 ENCOUNTER — Ambulatory Visit (INDEPENDENT_AMBULATORY_CARE_PROVIDER_SITE_OTHER): Payer: 59 | Admitting: Cardiology

## 2014-01-16 ENCOUNTER — Encounter: Payer: Self-pay | Admitting: Cardiology

## 2014-01-16 VITALS — BP 122/78 | HR 66 | Ht 69.0 in | Wt 182.1 lb

## 2014-01-16 DIAGNOSIS — I119 Hypertensive heart disease without heart failure: Secondary | ICD-10-CM

## 2014-01-16 DIAGNOSIS — K219 Gastro-esophageal reflux disease without esophagitis: Secondary | ICD-10-CM

## 2014-01-16 DIAGNOSIS — E785 Hyperlipidemia, unspecified: Secondary | ICD-10-CM

## 2014-01-16 DIAGNOSIS — E876 Hypokalemia: Secondary | ICD-10-CM

## 2014-01-16 LAB — BASIC METABOLIC PANEL
BUN: 14 mg/dL (ref 6–23)
CALCIUM: 9.4 mg/dL (ref 8.4–10.5)
CHLORIDE: 105 meq/L (ref 96–112)
CO2: 28 mEq/L (ref 19–32)
Creatinine, Ser: 0.8 mg/dL (ref 0.4–1.2)
GFR: 76.14 mL/min (ref >60.00)
GLUCOSE: 92 mg/dL (ref 70–99)
POTASSIUM: 3.8 meq/L (ref 3.5–5.1)
SODIUM: 139 meq/L (ref 135–145)

## 2014-01-16 LAB — HEPATIC FUNCTION PANEL
ALBUMIN: 3.8 g/dL (ref 3.5–5.2)
ALT: 14 U/L (ref 0–35)
AST: 18 U/L (ref 0–37)
Alkaline Phosphatase: 50 U/L (ref 39–117)
Bilirubin, Direct: 0.1 mg/dL (ref 0.0–0.3)
Total Bilirubin: 0.4 mg/dL (ref 0.2–1.2)
Total Protein: 6.6 g/dL (ref 6.0–8.3)

## 2014-01-16 LAB — LIPID PANEL
CHOL/HDL RATIO: 4
Cholesterol: 177 mg/dL (ref 0–200)
HDL: 45.6 mg/dL (ref >39.00)
LDL CALC: 77 mg/dL (ref 0–99)
TRIGLYCERIDES: 273 mg/dL — AB (ref 0.0–149.0)
VLDL: 54.6 mg/dL — AB (ref 0.0–40.0)

## 2014-01-16 MED ORDER — PANTOPRAZOLE SODIUM 40 MG PO TBEC: 40.0000 mg | DELAYED_RELEASE_TABLET | Freq: Every day | ORAL | Status: DC

## 2014-01-16 NOTE — Assessment & Plan Note (Signed)
The patient has a history of hyperlipidemia. She is not having any symptoms from her Lipitor 80 mg daily.  Blood work today is pending.  Has a strong family history of coronary disease on both sides of her family
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
## 2014-01-16 NOTE — Assessment & Plan Note (Signed)
Patient is not having any chest pain or shortness of breath.  The patient has had no symptoms of CHF.

## 2014-01-16 NOTE — Patient Instructions (Addendum)
Will obtain labs today and call you with the results (lp/bmet/hfp)  START PROTONIX 40 MG DAILY, RX S[facility]  Your physician recommends that you schedule a follow-up appointment in: 4 months with fasting labs (lp/bmet/hfp)

## 2014-01-16 NOTE — Assessment & Plan Note (Signed)
The patient continues to have a lot of problems with GI reflux.  We will refill her Protonix at her request today.  She has not been having any hematochezia or melena.

## 2014-01-17 ENCOUNTER — Telehealth: Payer: Self-pay | Admitting: *Deleted

## 2014-01-17 NOTE — Telephone Encounter (Signed)
Message copied by Earvin Hansen on Thu Jan 17, 2014  2:41 PM ------      Message from: Darlin Coco      Created: Thu Jan 17, 2014  8:13 AM       Please report to patient.  The recent labs are stable. Continue same medication and careful diet.  Triglycerides are high.  Watch sweets and carbohydrates.  Lose more weight. ------

## 2014-01-17 NOTE — Telephone Encounter (Signed)
Mailed copy of labs and left message to call if any questions and released in Mychart

## 2014-01-17 NOTE — Progress Notes (Signed)
Quick Note:  Please report to patient. The recent labs are stable. Continue same medication and careful diet. Triglycerides are high. Watch sweets and carbohydrates. Lose more weight. ______

## 2014-03-11 ENCOUNTER — Other Ambulatory Visit: Payer: Self-pay

## 2014-03-11 MED ORDER — LOSARTAN POTASSIUM-HCTZ 100-12.5 MG PO TABS: ORAL_TABLET | ORAL | Status: DC

## 2014-03-18 ENCOUNTER — Other Ambulatory Visit: Payer: Self-pay | Admitting: Cardiology

## 2014-04-17 ENCOUNTER — Other Ambulatory Visit: Payer: Self-pay

## 2014-04-17 MED ORDER — ATORVASTATIN CALCIUM 80 MG PO TABS: ORAL_TABLET | ORAL | Status: DC

## 2014-05-24 ENCOUNTER — Ambulatory Visit (INDEPENDENT_AMBULATORY_CARE_PROVIDER_SITE_OTHER): Payer: 59 | Admitting: Cardiology

## 2014-05-24 ENCOUNTER — Other Ambulatory Visit: Payer: 59

## 2014-05-24 ENCOUNTER — Encounter: Payer: Self-pay | Admitting: Cardiology

## 2014-05-24 VITALS — BP 126/82 | HR 89 | Ht 69.0 in | Wt 179.4 lb

## 2014-05-24 DIAGNOSIS — I119 Hypertensive heart disease without heart failure: Secondary | ICD-10-CM

## 2014-05-24 DIAGNOSIS — E785 Hyperlipidemia, unspecified: Secondary | ICD-10-CM

## 2014-05-24 DIAGNOSIS — E876 Hypokalemia: Secondary | ICD-10-CM

## 2014-05-24 DIAGNOSIS — K219 Gastro-esophageal reflux disease without esophagitis: Secondary | ICD-10-CM

## 2014-05-24 LAB — BASIC METABOLIC PANEL
BUN: 14 mg/dL (ref 6–23)
CALCIUM: 9.2 mg/dL (ref 8.4–10.5)
CHLORIDE: 100 meq/L (ref 96–112)
CO2: 30 mEq/L (ref 19–32)
CREATININE: 0.8 mg/dL (ref 0.4–1.2)
GFR: 78.28 mL/min (ref >60.00)
Glucose, Bld: 99 mg/dL (ref 70–99)
Potassium: 3.6 mEq/L (ref 3.5–5.1)
Sodium: 137 mEq/L (ref 135–145)

## 2014-05-24 LAB — HEPATIC FUNCTION PANEL
ALBUMIN: 4 g/dL (ref 3.5–5.2)
ALK PHOS: 66 U/L (ref 39–117)
ALT: 15 U/L (ref 0–35)
AST: 20 U/L (ref 0–37)
Bilirubin, Direct: 0.1 mg/dL (ref 0.0–0.3)
Total Bilirubin: 0.8 mg/dL (ref 0.2–1.2)
Total Protein: 7.2 g/dL (ref 6.0–8.3)

## 2014-05-24 LAB — LIPID PANEL
CHOLESTEROL: 193 mg/dL (ref 0–200)
HDL: 43.1 mg/dL (ref >39.00)
NonHDL: 149.9
Total CHOL/HDL Ratio: 4
Triglycerides: 370 mg/dL — ABNORMAL HIGH (ref 0.0–149.0)
VLDL: 74 mg/dL — AB (ref 0.0–40.0)

## 2014-05-24 LAB — LDL CHOLESTEROL, DIRECT: LDL DIRECT: 101 mg/dL

## 2014-05-24 NOTE — Patient Instructions (Signed)
Will obtain labs today and call you with the results (LP/BMET/HFP)  Your physician recommends that you continue on your current medications as directed. Please refer to the Current Medication list given to you today.  Your physician wants you to follow-up in: 4 months with fasting labs (lp/bmet/hfp) You will receive a reminder letter in the mail two months in advance. If you don't receive a letter, please call our office to schedule the follow-up appointment.

## 2014-05-24 NOTE — Assessment & Plan Note (Signed)
Her abdominal pain and dyspepsia have improved on Protonix.  She has to avoid spicy foods.

## 2014-05-24 NOTE — Assessment & Plan Note (Signed)
Blood pressure is remaining stable on current medication.  No side effects.  Denies chest pain or shortness of breath or palpitations

## 2014-05-24 NOTE — Assessment & Plan Note (Signed)
She is not having any side effects from the atorvastatin.  We are checking labs today.

## 2014-05-24 NOTE — Progress Notes (Signed)
Cathleen Fears Jakubek Date of Birth:  04-Feb-1951 [facility] [address] [place], [place]  [address] [phone]        Fax   [phone]   History of Present Illness: This pleasant 63 year old woman is seen for a scheduled four-month followup office visit. She has a past history of hypercholesterolemia and a history of essential hypertension. She does not have any history of ischemic heart disease. She had a normal nuclear stress test in 2005. He has had a lot of problems with upper abdominal and epigastric discomfort. When we last saw her in January 2013 she was having similar symptoms. We noted at that time she had a normal ultrasound of her abdomen in 2008 which specifically did not show any gallbladder pathology.    Current Outpatient Prescriptions  Medication Sig Dispense Refill  . atorvastatin (LIPITOR) 80 MG tablet TAKE 1 TABLET BY MOUTH EVERY DAY  90 tablet  0  . Cholecalciferol (VITAMIN D) 1000 UNITS capsule Take 1,000 Units by mouth daily. OCCASIONALLY       . cyclobenzaprine (FLEXERIL) 5 MG tablet Take 1 tablet (5 mg total) by mouth 3 (three) times daily as needed for muscle spasms.  30 tablet  3  . diclofenac (VOLTAREN) 75 MG EC tablet Take 75 mg by mouth 2 (two) times daily.      Marland Kitchen estrogens, conjugated, (PREMARIN) 0.9 MG tablet Take 0.625 mg by mouth daily.       Marland Kitchen HYDROcodone-acetaminophen (VICODIN) 5-500 MG per tablet Take 1 tablet by mouth every 6 (six) hours as needed.      Marland Kitchen KLOR-CON M20 20 MEQ tablet TAKE 1 TABLET BY MOUTH EVERY DAY  90 tablet  0  . losartan-hydrochlorothiazide (HYZAAR) 100-12.5 MG per tablet TAKE 1 TABLET EVERY DAY  90 tablet  1  . pantoprazole (PROTONIX) 40 MG tablet Take 1 tablet (40 mg total) by mouth daily.  90 tablet  3   No current facility-administered medications for this visit.    Allergies  Allergen Reactions  . Sulfa Antibiotics Nausea And Vomiting    Patient Active Problem List   Diagnosis Date Noted  .  Hyperlipidemia     Priority: High  . Flank pain 12/04/2010    Priority: Medium  . Abdominal pain 01/21/2012  . Benign hypertensive heart disease without heart failure 04/23/2011  . GERD (gastroesophageal reflux disease)     History  Smoking status  . Former Smoker  Smokeless tobacco  . Not on file    History  Alcohol Use No    Family History  Problem Relation Age of Onset  . Coronary artery disease Father   . Coronary artery disease Mother   . Pancreatic cancer Mother     Review of Systems: Constitutional: no fever chills diaphoresis or fatigue or change in weight.  Head and neck: no hearing loss, no epistaxis, no photophobia or visual disturbance. Respiratory: No cough, shortness of breath or wheezing. Cardiovascular: No chest pain peripheral edema, palpitations. Gastrointestinal: No abdominal distention, no abdominal pain, no change in bowel habits hematochezia or melena. Genitourinary: No dysuria, no frequency, no urgency, no nocturia. Musculoskeletal:No arthralgias, no back pain, no gait disturbance or myalgias. Neurological: No dizziness, no headaches, no numbness, no seizures, no syncope, no weakness, no tremors. Hematologic: No lymphadenopathy, no easy bruising. Psychiatric: No confusion, no hallucinations, no sleep disturbance.    Physical Exam: Filed Vitals:   05/24/14 0959  BP: 126/82  Pulse: 89   the general  appearance reveals a well-developed well-nourished woman in no distress.The head and neck exam reveals pupils equal and reactive.  Extraocular movements are full.  There is no scleral icterus.  The mouth and pharynx are normal.  The neck is supple.  The carotids reveal no bruits.  The jugular venous pressure is normal.  The  thyroid is not enlarged.  There is no lymphadenopathy.  The chest is clear to percussion and auscultation.  There are no rales or rhonchi.  Expansion of the chest is symmetrical.  The precordium is quiet.  The first heart sound is  normal.  The second heart sound is physiologically split.  There is no murmur gallop rub or click.  There is no abnormal lift or heave.  The abdomen is soft and nontender.  The bowel sounds are normal.  The liver and spleen are not enlarged.  There are no abdominal masses.  There are no abdominal bruits.  Extremities reveal good pedal pulses.  There is no phlebitis or edema.  There is no cyanosis or clubbing.  Strength is normal and symmetrical in all extremities.  There is no lateralizing weakness.  There are no sensory deficits.  The skin is warm and dry.  There is no rash.     Assessment / Plan:  1. benign hypertensive heart disease without heart failure 2. Hyperlipidemia 3. GERD  Plan: Refilled Protonix today.  Blood work today is pending.  Recheck in 4 months for office visit and fasting lab work.  I gave her a prescription so that she could get a shingles shot.  Her husband Jeneen Rinks will also get one.

## 2014-05-25 NOTE — Progress Notes (Signed)
Quick Note:  Please report to patient. The recent labs are stable. Continue same medication and careful diet.TG are still high. Watch carbs, exercise and lose weight. ______

## 2014-08-06 ENCOUNTER — Other Ambulatory Visit: Payer: Self-pay | Admitting: Cardiology

## 2014-08-06 DIAGNOSIS — M791 Myalgia, unspecified site: Secondary | ICD-10-CM

## 2014-08-08 ENCOUNTER — Other Ambulatory Visit: Payer: Self-pay | Admitting: Cardiology

## 2014-08-08 DIAGNOSIS — M62838 Other muscle spasm: Secondary | ICD-10-CM

## 2014-08-23 ENCOUNTER — Other Ambulatory Visit: Payer: Self-pay | Admitting: Cardiology

## 2014-10-18 ENCOUNTER — Ambulatory Visit (INDEPENDENT_AMBULATORY_CARE_PROVIDER_SITE_OTHER): Payer: 59 | Admitting: Cardiology

## 2014-10-18 ENCOUNTER — Other Ambulatory Visit (INDEPENDENT_AMBULATORY_CARE_PROVIDER_SITE_OTHER): Payer: 59 | Admitting: *Deleted

## 2014-10-18 ENCOUNTER — Encounter: Payer: Self-pay | Admitting: Cardiology

## 2014-10-18 VITALS — BP 134/86 | HR 61 | Ht 69.0 in | Wt 179.0 lb

## 2014-10-18 DIAGNOSIS — E785 Hyperlipidemia, unspecified: Secondary | ICD-10-CM

## 2014-10-18 DIAGNOSIS — I119 Hypertensive heart disease without heart failure: Secondary | ICD-10-CM

## 2014-10-18 LAB — LIPID PANEL
CHOL/HDL RATIO: 4
CHOLESTEROL: 184 mg/dL (ref 0–200)
HDL: 46 mg/dL (ref >39.00)
NONHDL: 138
Triglycerides: 284 mg/dL — ABNORMAL HIGH (ref 0.0–149.0)
VLDL: 56.8 mg/dL — ABNORMAL HIGH (ref 0.0–40.0)

## 2014-10-18 LAB — BASIC METABOLIC PANEL
BUN: 11 mg/dL (ref 6–23)
CO2: 30 meq/L (ref 19–32)
CREATININE: 0.77 mg/dL (ref 0.40–1.20)
Calcium: 9.3 mg/dL (ref 8.4–10.5)
Chloride: 103 mEq/L (ref 96–112)
GFR: 80.52 mL/min (ref >60.00)
GLUCOSE: 92 mg/dL (ref 70–99)
POTASSIUM: 3.8 meq/L (ref 3.5–5.1)
Sodium: 138 mEq/L (ref 135–145)

## 2014-10-18 LAB — HEPATIC FUNCTION PANEL
ALBUMIN: 4.1 g/dL (ref 3.5–5.2)
ALK PHOS: 65 U/L (ref 39–117)
ALT: 15 U/L (ref 0–35)
AST: 18 U/L (ref 0–37)
BILIRUBIN DIRECT: 0.1 mg/dL (ref 0.0–0.3)
TOTAL PROTEIN: 6.9 g/dL (ref 6.0–8.3)
Total Bilirubin: 0.7 mg/dL (ref 0.2–1.2)

## 2014-10-18 LAB — LDL CHOLESTEROL, DIRECT: LDL DIRECT: 85 mg/dL

## 2014-10-18 MED ORDER — PROMETHAZINE HCL 25 MG PO TABS
25.0000 mg | ORAL_TABLET | Freq: Four times a day (QID) | ORAL | Status: DC | PRN
Start: 2014-10-18 — End: 2015-09-09

## 2014-10-18 NOTE — Addendum Note (Signed)
Addended by: Eulis Foster on: 10/18/2014 09:44 AM   Modules accepted: Orders

## 2014-10-18 NOTE — Progress Notes (Signed)
Cardiology Office Note   Date:  10/18/2014   ID:  Veronica Mcclain, DOB 1951-08-15, MRN [id number]  PCP:  Veronica Peaches, MD  Cardiologist:   Darlin Coco, MD   No chief complaint on file.     History of Present Illness: Veronica Mcclain is a 64 y.o. female who presents for scheduled follow-up office visit.  This pleasant 64 year old woman is seen for a scheduled four-month followup office visit. She has a past history of hypercholesterolemia and a history of essential hypertension. She does not have any history of ischemic heart disease. She had a normal nuclear stress test in 2005. He has had a lot of problems with upper abdominal and epigastric discomfort. When we last saw her in January 2013 she was having similar symptoms. We noted at that time she had a normal ultrasound of her abdomen in 2008 which specifically did not show any gallbladder pathology.  She has been having occasional episodes of nausea.  She is requesting something for nausea and we will give her some generic Phenergan 25 mg tablets 1 every 6 hours as needed for nausea. She retired from work last week.  She will now be looking after her newborn grandchild. Her husband just had successful back surgery last week and is recuperating well at home. Since last visit she has been feeling well.  No chest pain or shortness of breath.  As long she takes her protonic she does not have much symptoms from her GERD.  Past Medical History  Diagnosis Date  . Hyperlipidemia   . GERD (gastroesophageal reflux disease)   . Cancer     Past Surgical History  Procedure Laterality Date  . Back surgery    . Foot surgery    . Appendectomy    . Abdominal hysterectomy    . Breast surgery    . Cesarean section       Current Outpatient Prescriptions  Medication Sig Dispense Refill  . atorvastatin (LIPITOR) 80 MG tablet TAKE 1 TABLET BY MOUTH EVERY DAY 90 tablet 0  . Cholecalciferol (VITAMIN D) 1000 UNITS capsule Take 1,000 Units  by mouth daily. OCCASIONALLY     . cyclobenzaprine (FLEXERIL) 5 MG tablet TAKE 1 TABLET 3 TIMES A DAY AS NEEDED FOR MUSCLE SPASMS 30 tablet 0  . diclofenac (VOLTAREN) 75 MG EC tablet Take 75 mg by mouth 2 (two) times daily.    Marland Kitchen estrogens, conjugated, (PREMARIN) 0.9 MG tablet Take 0.625 mg by mouth daily.     Marland Kitchen HYDROcodone-acetaminophen (NORCO/VICODIN) 5-325 MG per tablet Take by mouth at bedtime as needed.  0  . KLOR-CON M20 20 MEQ tablet TAKE 1 TABLET BY MOUTH EVERY DAY 90 tablet 0  . losartan-hydrochlorothiazide (HYZAAR) 100-12.5 MG per tablet TAKE 1 TABLET EVERY DAY 90 tablet 1  . pantoprazole (PROTONIX) 40 MG tablet Take 1 tablet (40 mg total) by mouth daily. 90 tablet 3  . promethazine (PHENERGAN) 25 MG tablet Take 1 tablet (25 mg total) by mouth every 6 (six) hours as needed for nausea. 30 tablet 0   No current facility-administered medications for this visit.    Allergies:   Sulfa antibiotics    Social History:  The patient  reports that she has quit smoking. She does not have any smokeless tobacco history on file. She reports that she does not drink alcohol or use illicit drugs.   Family History:  The patient's family history includes Coronary artery disease in her father and mother; Pancreatic cancer in  her mother.    ROS:  Please see the history of present illness.   Otherwise, review of systems are positive for none.   All other systems are reviewed and negative.    PHYSICAL EXAM: VS:  BP 134/86 mmHg  Pulse 61  Ht 5\' 9"  (1.753 m)  Wt 179 lb (81.194 kg)  BMI 26.42 kg/m2 , BMI Body mass index is 26.42 kg/(m^2). GEN: Well nourished, well developed, in no acute distress HEENT: normal Neck: no JVD, carotid bruits, or masses Cardiac: RRR; no murmurs, rubs, or gallops,no edema  Respiratory:  clear to auscultation bilaterally, normal work of breathing GI: soft, nontender, nondistended, + BS MS: no deformity or atrophy Skin: warm and dry, no rash Neuro:  Strength and  sensation are intact Psych: euthymic mood, full affect   EKG:  EKG is not ordered today.    Recent Labs: 05/24/2014: ALT 15; BUN 14; Creatinine 0.8; Potassium 3.6; Sodium 137    Lipid Panel    Component Value Date/Time   CHOL 193 05/24/2014 1029   TRIG 370.0* 05/24/2014 1029   HDL 43.10 05/24/2014 1029   CHOLHDL 4 05/24/2014 1029   VLDL 74.0* 05/24/2014 1029   LDLCALC 77 01/16/2014 0928   LDLDIRECT 101.0 05/24/2014 1029      Wt Readings from Last 3 Encounters:  10/18/14 179 lb (81.194 kg)  05/24/14 179 lb 6.4 oz (81.375 kg)  01/16/14 182 lb 1.9 oz (82.609 kg)         ASSESSMENT AND PLAN:  1. benign hypertensive heart disease without heart failure 2. Hyperlipidemia 3. GERD 4.  Nausea possibly secondary to GERD   Current medicines are reviewed at length with the patient today.  The patient does not have concerns regarding medicines.  The following changes have been made:  no change  Labs/ tests ordered today include: Lipid panel, hepatic function panel, and basal metabolic panel   Orders Placed This Encounter  Procedures  . Hepatic function panel  . Lipid panel  . Basic metabolic panel  . Lipid panel  . Hepatic function panel  . Basic metabolic panel      Disposition:   FU with Dr. Mare Ferrari in 6 months for office visit, EKG, lipid panel, hepatic function panel, and basal metabolic panel. Continue current medications.  We are adding generic Phenergan 25 mg tablet one every 6 hours when necessary for nausea.  She anticipates she will needed only occasionally.   Signed, Darlin Coco, MD  10/18/2014 11:11 AM    [facility] [place], [place], [place]  [address] Phone: [phone]; Fax: [phone]

## 2014-10-18 NOTE — Patient Instructions (Signed)
Will obtain labs today and call you with the results (LP/BMET/HFP)  START PHENERGAN 25 MG 1 BY MOUTH EVERY 6 HOURS AS NEEDED FOR NAUSEA, [facility]  Your physician wants you to follow-up in: 6 months with fasting labs (lp/bmet/hfp) AND EKG You will receive a reminder letter in the mail two months in advance. If you don't receive a letter, please call our office to schedule the follow-up appointment.

## 2014-10-21 NOTE — Progress Notes (Signed)
Quick Note:  Please report to patient. The recent labs are stable. Continue same medication and careful diet. ______ 

## 2014-10-23 ENCOUNTER — Other Ambulatory Visit: Payer: Self-pay | Admitting: Cardiology

## 2014-11-03 ENCOUNTER — Other Ambulatory Visit: Payer: Self-pay | Admitting: Cardiology

## 2015-02-27 ENCOUNTER — Other Ambulatory Visit: Payer: Self-pay | Admitting: Cardiology

## 2015-05-26 ENCOUNTER — Other Ambulatory Visit: Payer: Self-pay | Admitting: Obstetrics

## 2015-05-26 DIAGNOSIS — N6313 Unspecified lump in the right breast, lower outer quadrant: Secondary | ICD-10-CM

## 2015-06-06 ENCOUNTER — Ambulatory Visit
Admission: RE | Admit: 2015-06-06 | Discharge: 2015-06-06 | Disposition: A | Discharge status: Home / Self Care | Payer: 59 | Source: Ambulatory Visit | Attending: Obstetrics | Admitting: Obstetrics

## 2015-06-06 DIAGNOSIS — N6313 Unspecified lump in the right breast, lower outer quadrant: Secondary | ICD-10-CM

## 2015-06-06 IMAGING — MG MM DIAG BREAST TOMO UNI RIGHT
3 series · 4 of 7 positions shown · non-contrast
Comparison: Previous exam(s).

ACR Breast Density Category a: The breast tissue is almost entirely
fatty.

CLINICAL DATA: Physician palpated a possible mass right breast 7
o'clock location after the patient had her in office screening
mammogram. History of reduction mammoplasty. The patient does not
palpate any abnormality in the right breast.

EXAM:
DIGITAL DIAGNOSTIC right MAMMOGRAM WITH 3D TOMOSYNTHESIS AND CAD

[R CC]
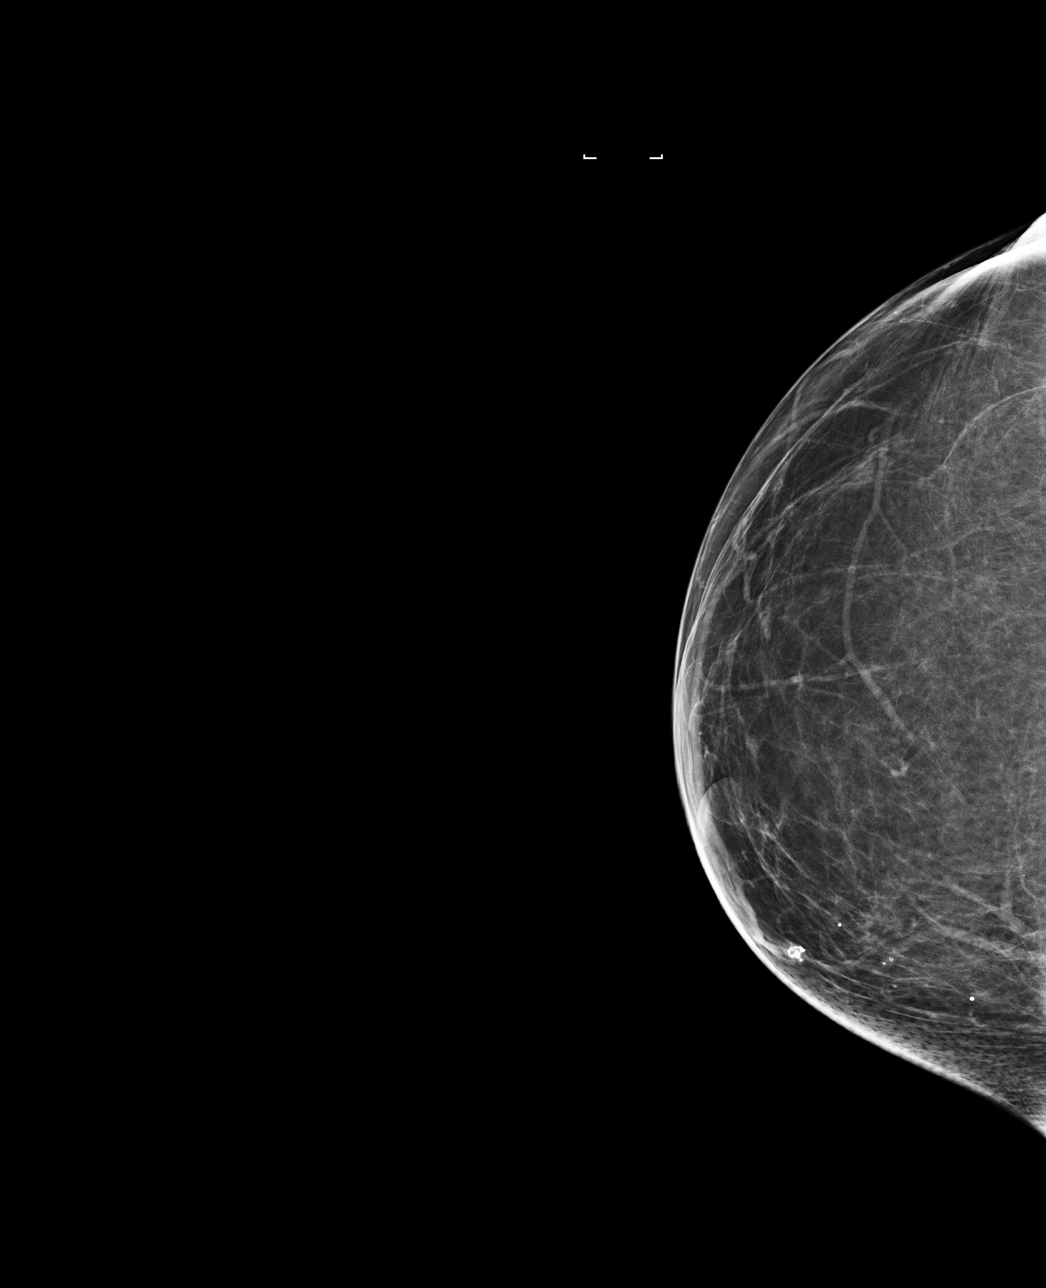

[R MLO]
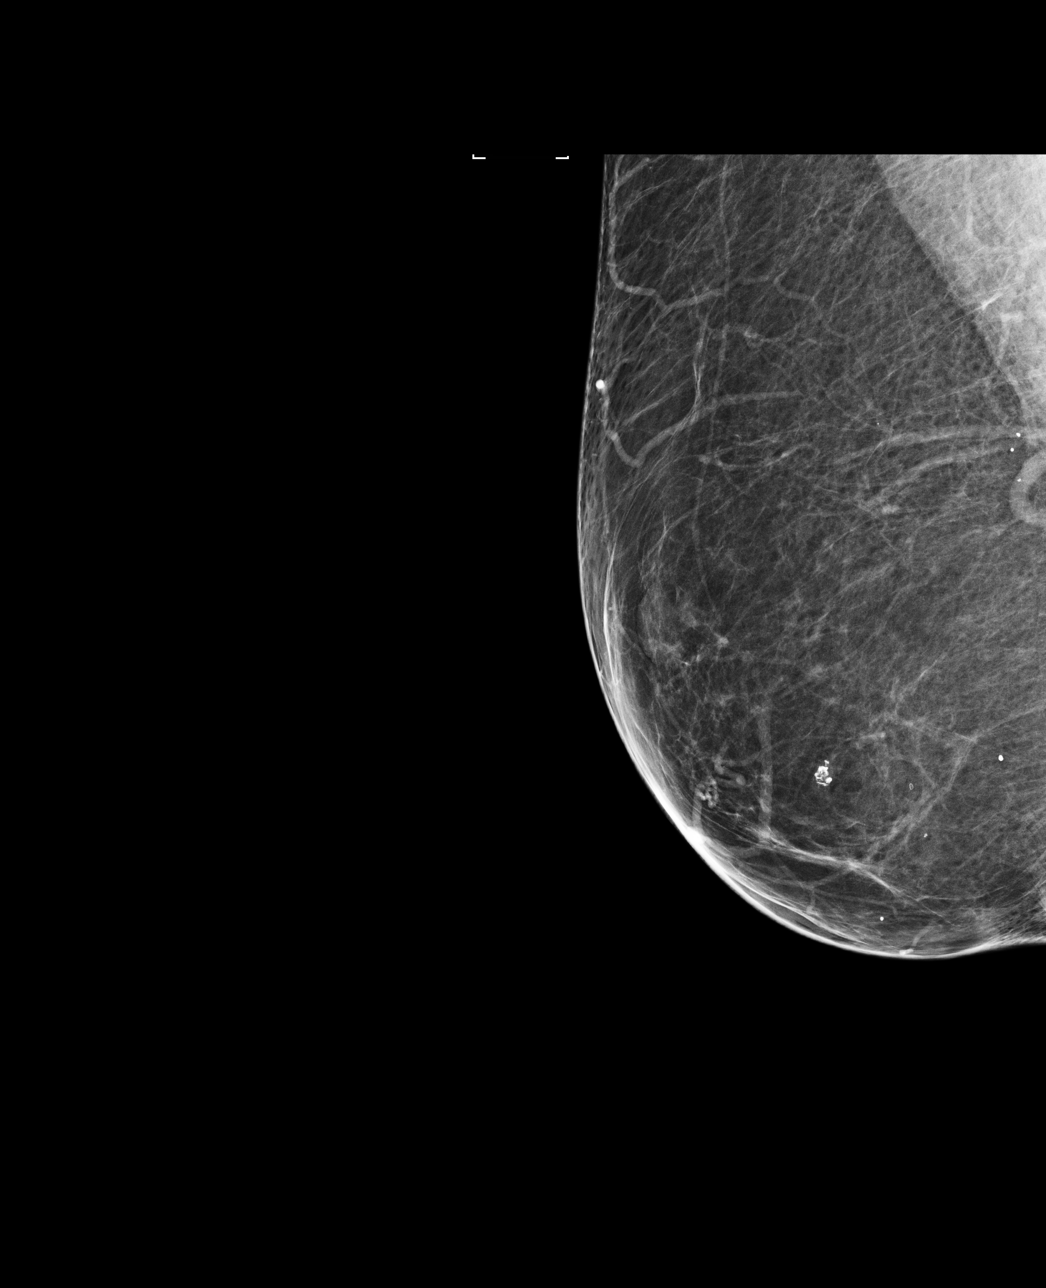

[R MLO tomo · 2 of 65 frames shown]
[frame 21/65]
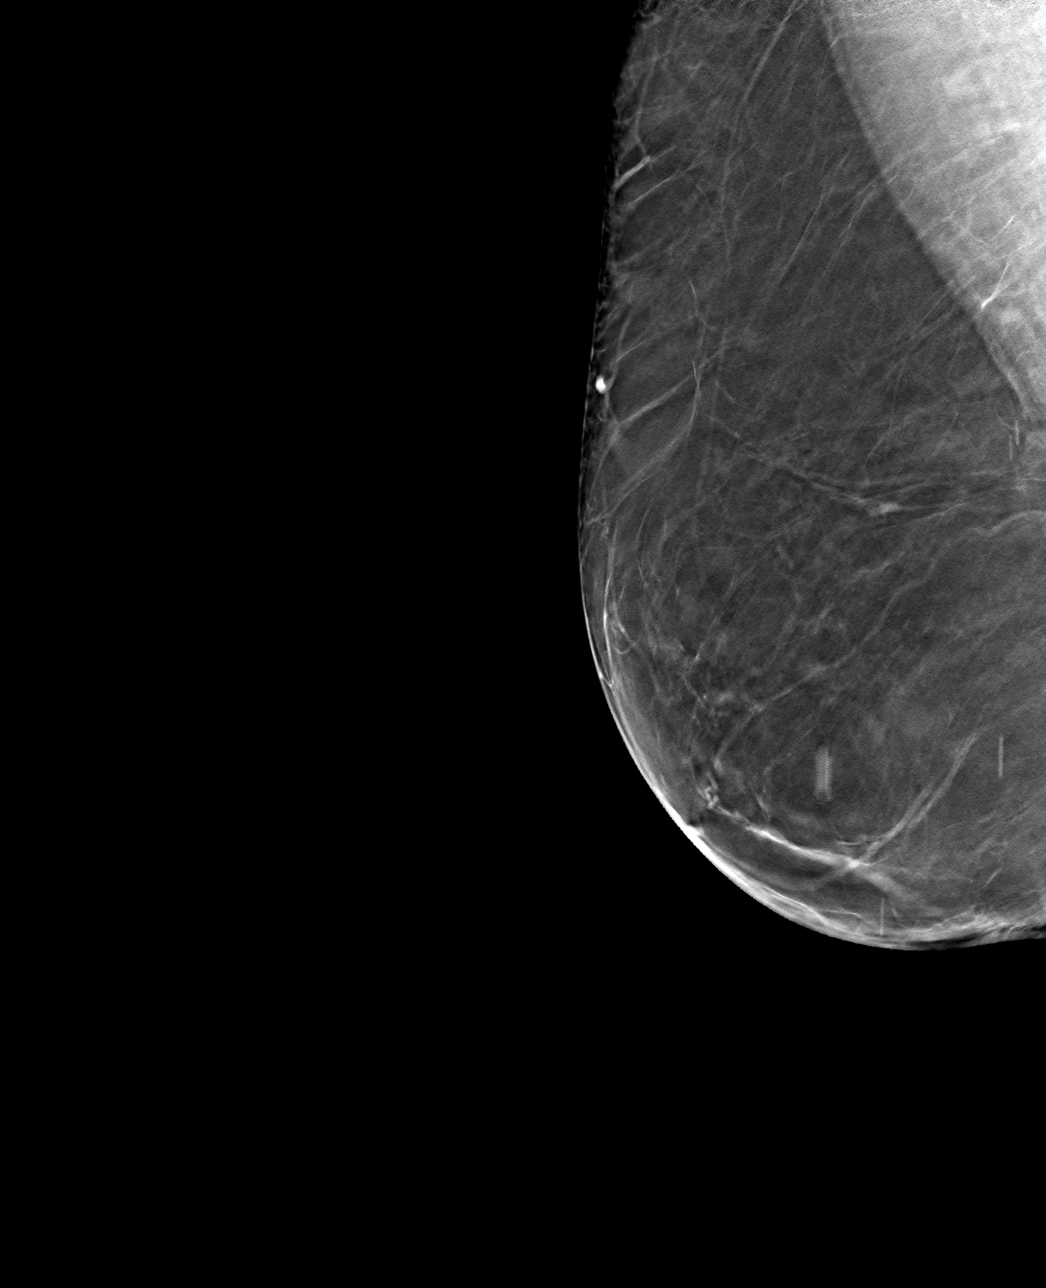
[frame 33/65]
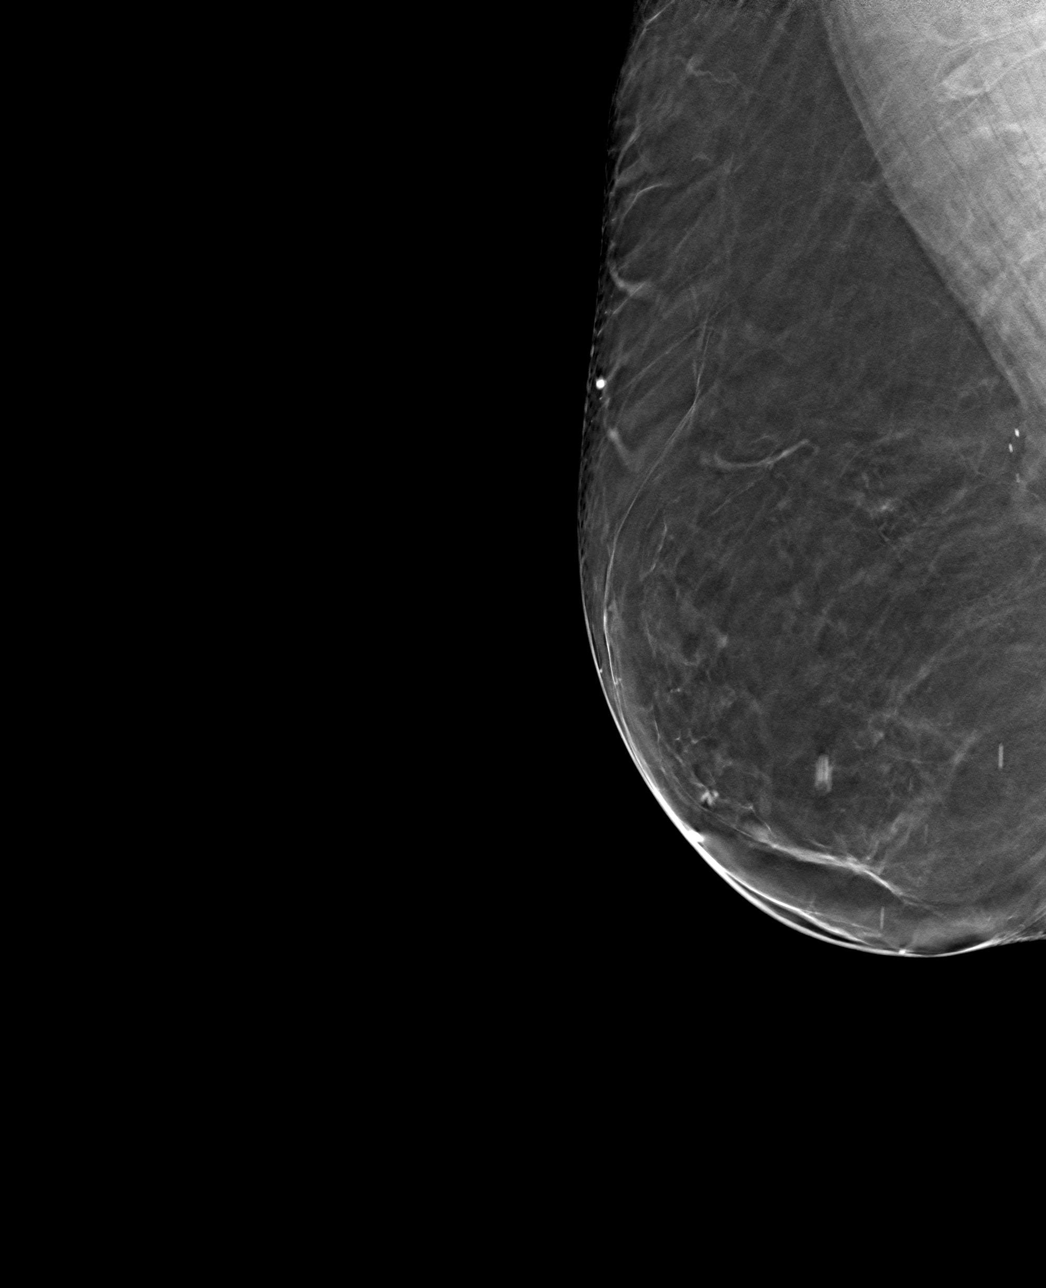

[4 of 7 positions shown; findings below may reference images not displayed]

FINDINGS: Reduction mammoplasty changes are noted. No abnormality is
identified apart from benign-appearing reduction changes in the
right breast.

Mammographic images were processed with CAD.
IMPRESSION: No evidence for malignancy. Any further management of the questioned
palpable finding should be dictated by clinical assessment.

RECOMMENDATION:
Screening mammogram in one year.(Code:PM-X-NET)

I have discussed the findings and recommendations with the patient.
Results were also provided in writing at the conclusion of the
visit. If applicable, a reminder letter will be sent to the patient
regarding the next appointment.

BI-RADS CATEGORY  2: Benign.

## 2015-06-15 ENCOUNTER — Other Ambulatory Visit: Payer: Self-pay | Admitting: Cardiology

## 2015-06-22 ENCOUNTER — Other Ambulatory Visit: Payer: Self-pay | Admitting: Cardiology

## 2015-07-10 ENCOUNTER — Other Ambulatory Visit: Payer: Self-pay | Admitting: Cardiology

## 2015-07-25 ENCOUNTER — Other Ambulatory Visit (INDEPENDENT_AMBULATORY_CARE_PROVIDER_SITE_OTHER): Payer: 59 | Admitting: *Deleted

## 2015-07-25 ENCOUNTER — Ambulatory Visit (INDEPENDENT_AMBULATORY_CARE_PROVIDER_SITE_OTHER): Payer: 59 | Admitting: Cardiology

## 2015-07-25 ENCOUNTER — Encounter: Payer: Self-pay | Admitting: Cardiology

## 2015-07-25 VITALS — BP 132/78 | HR 67 | Ht 69.0 in | Wt 180.0 lb

## 2015-07-25 DIAGNOSIS — E785 Hyperlipidemia, unspecified: Secondary | ICD-10-CM

## 2015-07-25 DIAGNOSIS — I119 Hypertensive heart disease without heart failure: Secondary | ICD-10-CM

## 2015-07-25 DIAGNOSIS — K219 Gastro-esophageal reflux disease without esophagitis: Secondary | ICD-10-CM

## 2015-07-25 LAB — LIPID PANEL
Cholesterol: 194 mg/dL (ref 125–200)
HDL: 46 mg/dL (ref >=46)
LDL CALC: 91 mg/dL (ref <130)
TRIGLYCERIDES: 283 mg/dL — AB (ref <150)
Total CHOL/HDL Ratio: 4.2 Ratio (ref <=5.0)
VLDL: 57 mg/dL — ABNORMAL HIGH (ref <30)

## 2015-07-25 LAB — HEPATIC FUNCTION PANEL
ALBUMIN: 4 g/dL (ref 3.6–5.1)
ALK PHOS: 73 U/L (ref 33–130)
ALT: 15 U/L (ref 6–29)
AST: 19 U/L (ref 10–35)
BILIRUBIN INDIRECT: 0.5 mg/dL (ref 0.2–1.2)
Bilirubin, Direct: 0.1 mg/dL (ref <=0.2)
TOTAL PROTEIN: 7.2 g/dL (ref 6.1–8.1)
Total Bilirubin: 0.6 mg/dL (ref 0.2–1.2)

## 2015-07-25 LAB — BASIC METABOLIC PANEL
BUN: 13 mg/dL (ref 7–25)
CALCIUM: 9.3 mg/dL (ref 8.6–10.4)
CO2: 27 mmol/L (ref 20–31)
CREATININE: 0.75 mg/dL (ref 0.50–0.99)
Chloride: 108 mmol/L (ref 98–110)
Glucose, Bld: 83 mg/dL (ref 65–99)
Potassium: 3.9 mmol/L (ref 3.5–5.3)
Sodium: 145 mmol/L (ref 135–146)

## 2015-07-25 NOTE — Patient Instructions (Signed)
Medication Instructions:  START BIOTIN ONE DAILY  Labwork: LP/BMET/HFP  Testing/Procedures: NONE  Follow-Up: AS NEEDED   If you need a refill on your cardiac medications before your next appointment, please call your pharmacy. Marland Kitchen

## 2015-07-25 NOTE — Addendum Note (Signed)
Addended by: Eulis Foster on: 07/25/2015 10:15 AM   Modules accepted: Orders

## 2015-07-25 NOTE — Addendum Note (Signed)
Addended by: Eulis Foster on: 07/25/2015 09:40 AM   Modules accepted: Orders

## 2015-07-25 NOTE — Progress Notes (Signed)
Cardiology Office Note   Date:  07/25/2015   ID:  Cathleen Fears Bolte, DOB 03-30-1951, MRN [id number]  PCP:  Criselda Peaches, MD  Cardiologist: Darlin Coco MD  No chief complaint on file.     History of Present Illness: Veronica Mcclain is a 64 y.o. female who presents for a six-month follow-up office visit.  . She has a past history of hypercholesterolemia and a history of essential hypertension. She does not have any history of ischemic heart disease. She had a normal nuclear stress test in 2005. He has had a lot of problems with upper abdominal and epigastric discomfort. When we last saw her in January 2013 she was having similar symptoms. We noted at that time she had a normal ultrasound of her abdomen in 2008 which specifically did not show any gallbladder pathology. She has been having occasional episodes of nausea. She uses Phenergan 25 mg tablets on a when necessary basis She retired from work . She will now be looking after her newborn grandchild.  Since last visit she has been feeling well. No chest pain or shortness of breath. As long she takes her protonic she does not have much symptoms from her GERD. She has a very strong family history of vascular disease.  Her parents were patient's in this practice who are now deceased.   Past Medical History  Diagnosis Date  . Hyperlipidemia   . GERD (gastroesophageal reflux disease)   . Cancer [facility])     Past Surgical History  Procedure Laterality Date  . Back surgery    . Foot surgery    . Appendectomy    . Abdominal hysterectomy    . Breast surgery    . Cesarean section       Current Outpatient Prescriptions  Medication Sig Dispense Refill  . atorvastatin (LIPITOR) 80 MG tablet TAKE 1 TABLET BY MOUTH EVERY DAY 90 tablet 2  . Biotin 10 MG TABS Take by mouth.    . Cholecalciferol (VITAMIN D) 1000 UNITS capsule Take 1,000 Units by mouth daily. OCCASIONALLY     . cyclobenzaprine (FLEXERIL) 5 MG tablet TAKE 1  TABLET 3 TIMES A DAY AS NEEDED FOR MUSCLE SPASMS 30 tablet 0  . diclofenac (VOLTAREN) 75 MG EC tablet Take 75 mg by mouth 2 (two) times daily.    Marland Kitchen escitalopram (LEXAPRO) 10 MG tablet Take 10 mg by mouth daily.    Marland Kitchen HYDROcodone-acetaminophen (NORCO) 10-325 MG tablet TAKE 1 TO 2 TABLETS BY MOUTH EVERY DAY AS NEEDED FOR PAIN  0  . KLOR-CON M20 20 MEQ tablet TAKE 1 TABLET BY MOUTH EVERY DAY 90 tablet 0  . losartan-hydrochlorothiazide (HYZAAR) 100-12.5 MG tablet Take 1 tablet by mouth daily. 90 tablet 0  . PENNSAID 2 % SOLN Apply 1 application topically 2 (two) times daily as needed. Arthritis pain  3  . PREMARIN 0.3 MG tablet Take 0.3 mg by mouth daily.  3  . promethazine (PHENERGAN) 25 MG tablet Take 1 tablet (25 mg total) by mouth every 6 (six) hours as needed for nausea. 30 tablet 0   No current facility-administered medications for this visit.    Allergies:   Sulfa antibiotics    Social History:  The patient  reports that she has quit smoking. She does not have any smokeless tobacco history on file. She reports that she does not drink alcohol or use illicit drugs.   Family History:  The patient's family history includes Coronary artery disease in her  father and mother; Pancreatic cancer in her mother.    ROS:  Please see the history of present illness.   Otherwise, review of systems are positive for none.   All other systems are reviewed and negative.    PHYSICAL EXAM: VS:  BP 132/78 mmHg  Pulse 67  Ht 5\' 9"  (1.753 m)  Wt 180 lb (81.647 kg)  BMI 26.57 kg/m2 , BMI Body mass index is 26.57 kg/(m^2). GEN: Well nourished, well developed, in no acute distress HEENT: normal Neck: no JVD, carotid bruits, or masses Cardiac: RRR; no murmurs, rubs, or gallops,no edema  Respiratory:  clear to auscultation bilaterally, normal work of breathing GI: soft, nontender, nondistended, + BS MS: no deformity or atrophy Skin: warm and dry, no rash Neuro:  Strength and sensation are intact Psych:  euthymic mood, full affect   EKG:  EKG is ordered today. The ekg ordered today demonstrates normal sinus rhythm at 67 bpm.  Within normal limits   Recent Labs: 10/18/2014: ALT 15; BUN 11; Creatinine, Ser 0.77; Potassium 3.8; Sodium 138    Lipid Panel    Component Value Date/Time   CHOL 184 10/18/2014 1010   TRIG 284.0* 10/18/2014 1010   HDL 46.00 10/18/2014 1010   CHOLHDL 4 10/18/2014 1010   VLDL 56.8* 10/18/2014 1010   LDLCALC 77 01/16/2014 0928   LDLDIRECT 85.0 10/18/2014 1010      Wt Readings from Last 3 Encounters:  07/25/15 180 lb (81.647 kg)  10/18/14 179 lb (81.194 kg)  05/24/14 179 lb 6.4 oz (81.375 kg)        ASSESSMENT AND PLAN:  1. benign hypertensive heart disease without heart failure 2. Hyperlipidemia 3. GERD 4. Nausea possibly secondary to GERD   Current medicines are reviewed at length with the patient today.  The patient does not have concerns regarding medicines.  The following changes have been made:  no change  Labs/ tests ordered today include:   Orders Placed This Encounter  Procedures  . Lipid panel  . Hepatic function panel  . Basic metabolic panel  . EKG 12-Lead     Disposition:  Continue current medication.  Start Biotin 1 capsule daily for fingernails and hair.  Fasting lab work was drawn today, results pending.  She will call Dr. Wynonia Lawman for cardiology follow-up following my retirement.  She will be calling [facility] to establish a PCP Signed, Darlin Coco MD 07/25/2015 1:16 PM    [address], [place],   [address] Phone: [phone]; Fax: [phone]

## 2015-07-25 NOTE — Addendum Note (Signed)
Addended by: Eulis Foster on: 07/25/2015 09:39 AM   Modules accepted: Orders

## 2015-07-27 NOTE — Progress Notes (Signed)
Quick Note:  Please report to patient. The recent labs are stable. Continue same medication and careful diet. TGs still high--watch carbs. ______

## 2015-08-31 ENCOUNTER — Encounter: Payer: Self-pay | Admitting: Cardiology

## 2015-08-31 ENCOUNTER — Other Ambulatory Visit: Payer: Self-pay | Admitting: Cardiology

## 2015-09-09 ENCOUNTER — Other Ambulatory Visit: Payer: Self-pay | Admitting: *Deleted

## 2015-09-09 DIAGNOSIS — R110 Nausea: Secondary | ICD-10-CM

## 2015-09-09 MED ORDER — PROMETHAZINE HCL 25 MG PO TABS: 25.0000 mg | ORAL_TABLET | Freq: Four times a day (QID) | ORAL | Status: DC | PRN

## 2015-09-09 NOTE — Telephone Encounter (Signed)
Patient requesting refill on Phenergan. Has appointment with PCP but not until March Will forward to  Dr. Mare Ferrari for review

## 2015-09-09 NOTE — Telephone Encounter (Signed)
Okay to refill phenergen

## 2015-10-16 ENCOUNTER — Other Ambulatory Visit: Payer: Self-pay | Admitting: Cardiology

## 2015-10-16 DIAGNOSIS — K219 Gastro-esophageal reflux disease without esophagitis: Secondary | ICD-10-CM

## 2015-10-17 NOTE — Telephone Encounter (Signed)
Medication was removed with a reason of patient preference on 07/25/15 at office visit. Pharmacy is requesting refill. Please advise. Thanks, MI

## 2015-11-06 ENCOUNTER — Other Ambulatory Visit: Payer: Self-pay | Admitting: Cardiology

## 2016-07-09 ENCOUNTER — Ambulatory Visit: Payer: 59 | Admitting: Radiation Oncology

## 2016-07-09 ENCOUNTER — Ambulatory Visit: Payer: 59

## 2016-09-08 ENCOUNTER — Encounter: Payer: Self-pay | Admitting: Radiation Oncology

## 2016-09-17 ENCOUNTER — Ambulatory Visit
Admission: RE | Admit: 2016-09-17 | Discharge: 2016-09-17 | Disposition: A | Discharge status: Home / Self Care | Payer: 59 | Source: Ambulatory Visit | Attending: Radiation Oncology | Admitting: Radiation Oncology

## 2016-09-17 ENCOUNTER — Other Ambulatory Visit: Payer: Self-pay | Admitting: Radiation Oncology

## 2016-09-17 DIAGNOSIS — C499 Malignant neoplasm of connective and soft tissue, unspecified: Secondary | ICD-10-CM | POA: Insufficient documentation

## 2016-09-17 DIAGNOSIS — C801 Malignant (primary) neoplasm, unspecified: Secondary | ICD-10-CM

## 2016-09-27 ENCOUNTER — Ambulatory Visit: Payer: 59 | Admitting: Radiation Oncology

## 2016-09-29 NOTE — Progress Notes (Signed)
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 An MRI revealed a large left thigh mass measuring 12 cm in longitudinal dimension. Dr. Mylo Red performed a core biopsy on 06/29/16.   Completed  3 rounds of gemcitabine-docetaxel.   Needing pre operative radiation.  Hx of xrt? No Pacemaker? No Pregnant? No Methotrexate? No  66 year old female. Married. Former smoker. Mother had pancreatic cancer.  Denies pain at this time. Reports the pain mostly resolved with chemotherapy. Reports taking Norco 10/325 1-2 tablet before bed.   Patient ambulatory and denies any recent falls. Edema noted both ankles.   Denies any bowel or bladder complaints.   09/22/16 MRI report from [facility].

## 2016-09-30 ENCOUNTER — Ambulatory Visit
Admission: RE | Admit: 2016-09-30 | Discharge: 2016-09-30 | Disposition: A | Discharge status: Home / Self Care | Payer: 59 | Source: Ambulatory Visit | Attending: Radiation Oncology | Admitting: Radiation Oncology

## 2016-09-30 ENCOUNTER — Telehealth: Payer: Self-pay | Admitting: Radiation Oncology

## 2016-09-30 ENCOUNTER — Ambulatory Visit
Admission: RE | Admit: 2016-09-30 | Discharge: 2016-09-30 | Disposition: A | Discharge status: Home / Self Care | Payer: Medicare Other | Source: Ambulatory Visit | Attending: Radiation Oncology | Admitting: Radiation Oncology

## 2016-09-30 ENCOUNTER — Encounter: Payer: Self-pay | Admitting: Radiation Oncology

## 2016-09-30 VITALS — BP 140/67 | HR 109 | Resp 18 | Ht 68.0 in | Wt 178.0 lb

## 2016-09-30 DIAGNOSIS — Z87891 Personal history of nicotine dependence: Secondary | ICD-10-CM | POA: Insufficient documentation

## 2016-09-30 DIAGNOSIS — C4922 Malignant neoplasm of connective and soft tissue of left lower limb, including hip: Secondary | ICD-10-CM | POA: Insufficient documentation

## 2016-09-30 DIAGNOSIS — Z91040 Latex allergy status: Secondary | ICD-10-CM | POA: Insufficient documentation

## 2016-09-30 DIAGNOSIS — Z510 Encounter for antineoplastic radiation therapy: Secondary | ICD-10-CM | POA: Diagnosis present

## 2016-09-30 DIAGNOSIS — C499 Malignant neoplasm of connective and soft tissue, unspecified: Secondary | ICD-10-CM

## 2016-09-30 DIAGNOSIS — Z882 Allergy status to sulfonamides status: Secondary | ICD-10-CM | POA: Insufficient documentation

## 2016-09-30 NOTE — Progress Notes (Signed)
See progress noted under physician encounter.  

## 2016-09-30 NOTE — Progress Notes (Signed)
Leonard Downing working to have left femur MRIs from [facility] pushed over into PACS so they can be fused with CT/SIM image.

## 2016-09-30 NOTE — Progress Notes (Signed)
[place]         [phone] ________________________________  Initial Outpatient Consultation  Name: Veronica Mcclain MRN: [id number]  Date: 09/30/2016  DOB: 11-15-1950  REFERRING PHYSICIAN: Beverlee Nims Will*  DIAGNOSIS: Cancer Staging 66 yo woman with a pleomorphic cell sarcoma ([place]) of the left thigh  - Clinical stage from 09/30/2016: Stage III (T2b, N0, M0, G3) - Signed by Tyler Pita, MD on 09/30/2016     ICD-9-CM ICD-10-CM   1. Pleomorphic cell sarcoma (HCC) 171.9 C49.9    HISTORY OF PRESENT ILLNESS:Veronica Mcclain is a 66 y.o. female with a diagnosis of pleomorphic sarcoma of the left thigh. She originally noted thigh pain and a palpable growth at this site in August 2017. She had an MRI revealing a large left thigh mass measuring 10 x 4.5 x 12.9 cm on 06/25/16. She ultimately underwent a biopsy on 06/29/16 by Dr. Mylo Red which revealed a grade 2, pleomorphic sarcoma. She was counseled on the use of gemzar/taxotere chemotherapy which she began on 07/20/16 under the care of Dr. Merrie Roof at [facility] She has since completed 3 cycles of this regimen. She had a restaging MRI on 09/21/16 revealing persistence of the mass, now measuring 9.9 x 4.7 x 12.9 cm. She comes today to discuss the role of radiotherapy to this site.   PREVIOUS RADIATION THERAPY: No  Past Medical History:  Past Medical History:  Diagnosis Date  . Cancer ([place])   . GERD (gastroesophageal reflux disease)   . Hyperlipidemia     Past Surgical History: Past Surgical History:  Procedure Laterality Date  . ABDOMINAL HYSTERECTOMY    . APPENDECTOMY    . BACK SURGERY    . BREAST SURGERY    . CESAREAN SECTION    . FOOT SURGERY      Social History:  Social History   Social History  . Marital status: Married    Spouse name: N/A  . Number of children: N/A  . Years of education: N/A   Occupational History  . Not on file.   Social History Main Topics  . Smoking  status: Former Smoker    Packs/day: 0.75    Years: 10.00    Types: Cigarettes  . Smokeless tobacco: Never Used  . Alcohol use No  . Drug use: No  . Sexual activity: Yes    Birth control/ protection: None   Other Topics Concern  . Not on file   Social History Narrative  . No narrative on file    Family History: Family History  Problem Relation Age of Onset  . Coronary artery disease Father   . Coronary artery disease Mother   . Pancreatic cancer Mother    Medications:  Current Outpatient Prescriptions:  .  atorvastatin (LIPITOR) 80 MG tablet, TAKE 1 TABLET BY MOUTH EVERY DAY, Disp: 90 tablet, Rfl: 2 .  Cholecalciferol (VITAMIN D) 1000 UNITS capsule, Take 1,000 Units by mouth daily. OCCASIONALLY , Disp: , Rfl:  .  cyclobenzaprine (FLEXERIL) 5 MG tablet, TAKE 1 TABLET 3 TIMES A DAY AS NEEDED FOR MUSCLE SPASMS, Disp: 30 tablet, Rfl: 0 .  escitalopram (LEXAPRO) 10 MG tablet, Take 20 mg by mouth daily. , Disp: , Rfl:  .  gabapentin (NEURONTIN) 300 MG capsule, Take 600 mg by mouth 3 (three) times daily., Disp: , Rfl:  .  HYDROcodone-acetaminophen (NORCO) 10-325 MG tablet, TAKE 1 TO 2 TABLETS BY MOUTH EVERY DAY AS NEEDED FOR PAIN, Disp: , Rfl: 0 .  KLOR-CON M20 20 MEQ tablet, TAKE 1 TABLET BY MOUTH EVERY DAY, Disp: 90 tablet, Rfl: 0 .  pantoprazole (PROTONIX) 40 MG tablet, TAKE 1 TABLET BY MOUTH EVERY DAY, Disp: 90 tablet, Rfl: 0 .  promethazine (PHENERGAN) 25 MG tablet, Take 1 tablet (25 mg total) by mouth every 6 (six) hours as needed for nausea., Disp: 30 tablet, Rfl: 0   Allergies:  Allergies  Allergen Reactions  . Latex Swelling  . Sulfa Antibiotics Nausea And Vomiting     REVIEW OF SYSTEMS:  A 15 point review of systems is documented in the electronic medical record. This was obtained by the nursing staff. However, I reviewed this with the patient to discuss relevant findings and make appropriate changes.  Pertinent items are noted in HPI.   PHYSICAL EXAM:  Blood  pressure 140/67, pulse (!) 109, resp. rate 18, height _0  (1.727 m), weight 178 lb (80.7 kg), SpO2 100 %. In general this is a well appearing woman in no acute distress. She is alert and oriented x4 and appropriate throughout the examination. HEENT reveals that the patient is normocephalic, atraumatic. EOMs are intact. PERRLA. Skin is intact without any evidence of gross lesions. Cardiovascular exam reveals a regular rate and rhythm, no clicks rubs or murmurs are auscultated. Chest is clear to auscultation bilaterally. Lymphatic assessment is performed and does not reveal any adenopathy in the cervical, supraclavicular, axillary, or inguinal chains. Abdomen has active bowel sounds in all quadrants and is intact. The abdomen is soft, non tender, non distended. Lower extremities are negative for pretibial pitting edema, deep calf tenderness, cyanosis or clubbing. The left mid-lateral thigh has a palpable mass noted with indiscrete edges measuring approximately 13 cm in length.  KPS = 80  100 - Normal; no complaints; no evidence of disease. 90   - Able to carry on normal activity; minor signs or symptoms of disease. 80   - Normal activity with effort; some signs or symptoms of disease. 63   - Cares for self; unable to carry on normal activity or to do active work. 60   - Requires occasional assistance, but is able to care for most of his personal needs. 50   - Requires considerable assistance and frequent medical care. 25   - Disabled; requires special care and assistance. 3   - Severely disabled; hospital admission is indicated although death not imminent. 11   - Very sick; hospital admission necessary; active supportive treatment necessary. 10   - Moribund; fatal processes progressing rapidly. 0     - Dead  Karnofsky DA, Abelmann Hudson, Craver LS and Burchenal [facility] [phone]) The use of the nitrogen mustards in the palliative treatment of carcinoma: with particular reference to bronchogenic carcinoma  Cancer 1 [phone]  LABORATORY DATA:  Lab Results  Component Value Date   WBC 6.9 12/02/2011   HGB 13.4 12/02/2011   HCT 39.5 12/02/2011   MCV 86.6 12/02/2011   PLT 295 12/02/2011   Lab Results  Component Value Date   NA 145 07/25/2015   K 3.9 07/25/2015   CL 108 07/25/2015   CO2 27 07/25/2015   Lab Results  Component Value Date   ALT 15 07/25/2015   AST 19 07/25/2015   ALKPHOS 73 07/25/2015   BILITOT 0.6 07/25/2015     RADIOGRAPHY: No results found.    IMPRESSION/PLAN: 1. Pleomorphic Sarcoma of the left thigh. I met with the patient today and reviewed the pathology findings and reviews the nature of sarcomatous disease.  I reviewed the utility of radiotherapy in the treatment of such disease.  We discussed the risks, benefits, short, and long term effects of radiotherapy, and the patient is interested in proceeding.  At the end of the conversation, all questions were answered to the patient's satisfaction, and we will move forward with simulation next week.  We need to obtain her MRI of the thigh from [facility] last week.  We will plan 45 Gy in 25 fractions pre-op.  ------------------------------------------------   Tyler Pita, MD [place] Director and Director of Stereotactic Radiosurgery Direct Dial: [phone]  Fax: [phone] [email or web address]  Skype  [email or web address]  This document serves as a record of services personally performed by Shona Simpson, PA-C and Tyler Pita, MD. It was created on their behalf by Bethann Humble, a trained medical scribe. The creation of this record is based on the scribe's personal observations and the provider's statements to them. This document has been checked and approved by the attending provider.

## 2016-09-30 NOTE — Telephone Encounter (Signed)
Opened in error

## 2016-10-08 ENCOUNTER — Ambulatory Visit
Admission: RE | Admit: 2016-10-08 | Discharge: 2016-10-08 | Disposition: A | Discharge status: Home / Self Care | Payer: 59 | Source: Ambulatory Visit | Attending: Radiation Oncology | Admitting: Radiation Oncology

## 2016-10-08 DIAGNOSIS — Z510 Encounter for antineoplastic radiation therapy: Secondary | ICD-10-CM | POA: Diagnosis not present

## 2016-10-08 DIAGNOSIS — C499 Malignant neoplasm of connective and soft tissue, unspecified: Secondary | ICD-10-CM

## 2016-10-08 NOTE — Progress Notes (Signed)
  Radiation Oncology         [phone] ________________________________  Name: Litzy Casaletto Isidoro MRN: [id number]  Date: 10/08/2016  DOB: 29-Mar-1951  SIMULATION AND TREATMENT PLANNING NOTE    ICD-9-CM ICD-10-CM   1. Pleomorphic cell sarcoma (HCC) 171.9 C49.9     DIAGNOSIS:  66 yo woman with a Stage III (T2b, N0, M0, G3) pleomorphic cell sarcoma (HCC) of the left thigh  NARRATIVE:  The patient was brought to the [facility].  Identity was confirmed.  All relevant records and images related to the planned course of therapy were reviewed.  The patient freely provided informed written consent to proceed with treatment after reviewing the details related to the planned course of therapy. The consent form was witnessed and verified by the simulation staff.  Then, the patient was set-up in a stable reproducible  supine position for radiation therapy.  CT images were obtained.  Surface markings were placed.  The CT images were loaded into the planning software.  Then the target and avoidance structures were contoured.  Treatment planning then occurred.  The radiation prescription was entered and confirmed.  Then, I designed and supervised the construction of a total of 1 medically necessary complex treatment devices in the form of BodyFix positioner.  I have requested : Isodose Plan.  PLAN:  The patient will receive 45 Gy in 25 fractions with pre-op curative intent.  ________________________________  Sheral Apley. Tammi Klippel, M.D.

## 2016-10-13 ENCOUNTER — Ambulatory Visit
Admission: RE | Admit: 2016-10-13 | Discharge: 2016-10-13 | Disposition: A | Discharge status: Home / Self Care | Payer: 59 | Source: Ambulatory Visit | Attending: Radiation Oncology | Admitting: Radiation Oncology

## 2016-10-13 DIAGNOSIS — Z510 Encounter for antineoplastic radiation therapy: Secondary | ICD-10-CM | POA: Diagnosis not present

## 2016-10-14 ENCOUNTER — Ambulatory Visit
Admission: RE | Admit: 2016-10-14 | Discharge: 2016-10-14 | Disposition: A | Discharge status: Home / Self Care | Payer: 59 | Source: Ambulatory Visit | Attending: Radiation Oncology | Admitting: Radiation Oncology

## 2016-10-14 DIAGNOSIS — Z510 Encounter for antineoplastic radiation therapy: Secondary | ICD-10-CM | POA: Diagnosis not present

## 2016-10-15 ENCOUNTER — Encounter: Payer: Self-pay | Admitting: Radiation Oncology

## 2016-10-15 ENCOUNTER — Ambulatory Visit
Admission: RE | Admit: 2016-10-15 | Discharge: 2016-10-15 | Disposition: A | Discharge status: Home / Self Care | Payer: 59 | Source: Ambulatory Visit | Attending: Radiation Oncology | Admitting: Radiation Oncology

## 2016-10-15 ENCOUNTER — Ambulatory Visit: Payer: 59 | Admitting: Radiation Oncology

## 2016-10-15 VITALS — BP 131/77 | HR 94 | Temp 98.8°F | Ht 68.0 in | Wt 176.6 lb

## 2016-10-15 DIAGNOSIS — C499 Malignant neoplasm of connective and soft tissue, unspecified: Secondary | ICD-10-CM | POA: Insufficient documentation

## 2016-10-15 DIAGNOSIS — Z510 Encounter for antineoplastic radiation therapy: Secondary | ICD-10-CM | POA: Diagnosis not present

## 2016-10-15 MED ORDER — RADIAPLEXRX EX GEL
Freq: Once | CUTANEOUS | Status: AC
  Administered 2016-10-15: 16:00:00 via TOPICAL

## 2016-10-15 NOTE — Progress Notes (Signed)
  Radiation Oncology         [phone]   Name: Veronica Mcclain MRN: [id number]   Date: 10/15/2016  DOB: Aug 09, 1951     Weekly Radiation Therapy Management    ICD-9-CM ICD-10-CM   1. Pleomorphic cell sarcoma (HCC) 171.9 C49.9 hyaluronate sodium (RADIAPLEXRX) gel    Current Dose: 3.6 Gy  Planned Dose:  45 Gy  Narrative The patient presents for routine under treatment assessment.  Veronica Mcclain has completed 2 fractions to her left thigh. She reports having some soreness in her left thigh that was a little worse last night after radiation. She took ibuprofen which resolved the discomfort.She denies having any skin irritation or fatigue.     Set-up films were reviewed. The chart was checked.  Physical Findings  height is 5\' 8"  (1.727 m) and weight is 176 lb 9.6 oz (80.1 kg). Her oral temperature is 98.8 F (37.1 C). Her blood pressure is 131/77 and her pulse is 94. Her oxygen saturation is 99%. . Weight essentially stable.  No significant changes.   Impression The patient is tolerating radiation.  Plan Continue treatment as planned.     Sheral Apley Tammi Klippel, M.D.  This document serves as a record of services personally performed by Tyler Pita, MD. It was created on his behalf by Arlyce Harman, a trained medical scribe. The creation of this record is based on the scribe's personal observations and the provider's statements to them. This document has been checked and approved by the attending provider.

## 2016-10-15 NOTE — Progress Notes (Signed)
Veronica Mcclain has completed 2 fractions to her left thigh.  She reports having some soreness in her left thigh that was a little worse last night after radiation.  She denies having any skin irritation or fatigue.  BP 131/77 (BP Location: Right Arm, Patient Position: Sitting)   Pulse 94   Temp 98.8 F (37.1 C) (Oral)   Ht 5\' 8"  (1.727 m)   Wt 176 lb 9.6 oz (80.1 kg)   SpO2 99%   BMI 26.85 kg/m    Wt Readings from Last 3 Encounters:  10/15/16 176 lb 9.6 oz (80.1 kg)  09/30/16 178 lb (80.7 kg)  07/25/15 180 lb (81.6 kg)

## 2016-10-18 ENCOUNTER — Ambulatory Visit
Admission: RE | Admit: 2016-10-18 | Discharge: 2016-10-18 | Disposition: A | Discharge status: Home / Self Care | Payer: 59 | Source: Ambulatory Visit | Attending: Radiation Oncology | Admitting: Radiation Oncology

## 2016-10-18 ENCOUNTER — Ambulatory Visit: Payer: 59

## 2016-10-18 DIAGNOSIS — Z510 Encounter for antineoplastic radiation therapy: Secondary | ICD-10-CM | POA: Diagnosis not present

## 2016-10-19 ENCOUNTER — Ambulatory Visit: Payer: 59

## 2016-10-19 ENCOUNTER — Ambulatory Visit
Admission: RE | Admit: 2016-10-19 | Discharge: 2016-10-19 | Disposition: A | Discharge status: Home / Self Care | Payer: 59 | Source: Ambulatory Visit | Attending: Radiation Oncology | Admitting: Radiation Oncology

## 2016-10-19 DIAGNOSIS — Z510 Encounter for antineoplastic radiation therapy: Secondary | ICD-10-CM | POA: Diagnosis not present

## 2016-10-20 ENCOUNTER — Ambulatory Visit: Payer: 59 | Admitting: Radiation Oncology

## 2016-10-20 ENCOUNTER — Ambulatory Visit: Payer: 59

## 2016-10-20 ENCOUNTER — Ambulatory Visit
Admission: RE | Admit: 2016-10-20 | Discharge: 2016-10-20 | Disposition: A | Discharge status: Home / Self Care | Payer: 59 | Source: Ambulatory Visit | Attending: Radiation Oncology | Admitting: Radiation Oncology

## 2016-10-20 DIAGNOSIS — Z510 Encounter for antineoplastic radiation therapy: Secondary | ICD-10-CM | POA: Diagnosis not present

## 2016-10-21 ENCOUNTER — Inpatient Hospital Stay
Admission: RE | Admit: 2016-10-21 | Discharge: 2016-10-21 | Disposition: A | Discharge status: Home / Self Care | Payer: 59 | Source: Ambulatory Visit | Attending: Radiation Oncology | Admitting: Radiation Oncology

## 2016-10-21 ENCOUNTER — Ambulatory Visit: Payer: 59

## 2016-10-21 ENCOUNTER — Ambulatory Visit
Admission: RE | Admit: 2016-10-21 | Discharge: 2016-10-21 | Disposition: A | Discharge status: Home / Self Care | Payer: 59 | Source: Ambulatory Visit | Attending: Radiation Oncology | Admitting: Radiation Oncology

## 2016-10-21 VITALS — BP 100/62 | HR 89 | Resp 18 | Wt 170.8 lb

## 2016-10-21 DIAGNOSIS — Z510 Encounter for antineoplastic radiation therapy: Secondary | ICD-10-CM | POA: Diagnosis not present

## 2016-10-21 DIAGNOSIS — C499 Malignant neoplasm of connective and soft tissue, unspecified: Secondary | ICD-10-CM

## 2016-10-21 NOTE — Progress Notes (Signed)
Vitals stable. Six pound weight loss noted. Reports weight loss is intentional. Encouraged patient to maintain current weight until xrt is complete. Patient verbalized understanding. Denies pain. Reports tenderness in left thigh continues worse at night. Denies skin changes within treatment field. Reports using radiaplex as directed. Denies numbness or tingling of left leg. Steady gait noted. No edema of lower extremities noted. Reports constipation. Denies fatigue.   BP 100/62 (BP Location: Left Arm, Patient Position: Sitting, Cuff Size: Large)   Pulse 89   Resp 18   Wt 170 lb 12.8 oz (77.5 kg)   SpO2 100%   BMI 25.97 kg/m  Wt Readings from Last 3 Encounters:  10/21/16 170 lb 12.8 oz (77.5 kg)  10/15/16 176 lb 9.6 oz (80.1 kg)  09/30/16 178 lb (80.7 kg)

## 2016-10-21 NOTE — Progress Notes (Signed)
  Radiation Oncology         [phone]   Name: Veronica Mcclain MRN: [id number]   Date: 10/21/2016  DOB: 1951/01/06     Weekly Radiation Therapy Management    ICD-9-CM ICD-10-CM   1. Pleomorphic cell sarcoma (HCC) 171.9 C49.9     Current Dose: 10.8 Gy  Planned Dose:  45 Gy  Narrative The patient presents for routine under treatment assessment.  Six pound weight loss noted since 10/15/16. Reports weight loss is intentional. The nurse encouraged the patient to maintain her current weight until radiation is complete. Patient verbalized understanding. Reports tenderness in left thigh continues and is worse at night. The patient has noticed mild erythema of the left thigh. Denies numbness or tingling of the left leg. Reports no lower extremity edema. Reports using radiaplex as directed. Steady gait noted. Reports constipation, but states that this is not new. Denies fatigue.  Set-up films were reviewed. The chart was checked.  Physical Findings  weight is 170 lb 12.8 oz (77.5 kg). Her blood pressure is 100/62 and her pulse is 89. Her respiration is 18 and oxygen saturation is 100%. . Alert and oriented x 4. In no acute distress. Weight loss noted.  Impression The patient is tolerating radiation.  Plan Continue treatment as planned.     Sheral Apley Tammi Klippel, M.D.  This document serves as a record of services personally performed by Freeman Caldron, PA-C and Tyler Pita, MD. It was created on their behalf by Darcus Austin, a trained medical scribe. The creation of this record is based on the scribe's personal observations and the providers' statements to them. This document has been checked and approved by the attending provider.

## 2016-10-22 ENCOUNTER — Ambulatory Visit: Payer: 59 | Admitting: Radiation Oncology

## 2016-10-22 ENCOUNTER — Ambulatory Visit: Payer: 59

## 2016-10-22 ENCOUNTER — Ambulatory Visit
Admission: RE | Admit: 2016-10-22 | Discharge: 2016-10-22 | Disposition: A | Discharge status: Home / Self Care | Payer: 59 | Source: Ambulatory Visit | Attending: Radiation Oncology | Admitting: Radiation Oncology

## 2016-10-22 DIAGNOSIS — Z510 Encounter for antineoplastic radiation therapy: Secondary | ICD-10-CM | POA: Diagnosis not present

## 2016-10-25 ENCOUNTER — Ambulatory Visit
Admission: RE | Admit: 2016-10-25 | Discharge: 2016-10-25 | Disposition: A | Discharge status: Home / Self Care | Payer: 59 | Source: Ambulatory Visit | Attending: Radiation Oncology | Admitting: Radiation Oncology

## 2016-10-25 ENCOUNTER — Ambulatory Visit: Payer: 59

## 2016-10-25 DIAGNOSIS — Z510 Encounter for antineoplastic radiation therapy: Secondary | ICD-10-CM | POA: Diagnosis not present

## 2016-10-26 ENCOUNTER — Ambulatory Visit
Admission: RE | Admit: 2016-10-26 | Discharge: 2016-10-26 | Disposition: A | Discharge status: Home / Self Care | Payer: 59 | Source: Ambulatory Visit | Attending: Radiation Oncology | Admitting: Radiation Oncology

## 2016-10-26 ENCOUNTER — Ambulatory Visit: Payer: 59

## 2016-10-26 DIAGNOSIS — Z510 Encounter for antineoplastic radiation therapy: Secondary | ICD-10-CM | POA: Diagnosis not present

## 2016-10-27 ENCOUNTER — Ambulatory Visit
Admission: RE | Admit: 2016-10-27 | Discharge: 2016-10-27 | Disposition: A | Discharge status: Home / Self Care | Payer: 59 | Source: Ambulatory Visit | Attending: Radiation Oncology | Admitting: Radiation Oncology

## 2016-10-27 ENCOUNTER — Ambulatory Visit: Payer: 59

## 2016-10-27 DIAGNOSIS — Z510 Encounter for antineoplastic radiation therapy: Secondary | ICD-10-CM | POA: Diagnosis not present

## 2016-10-28 ENCOUNTER — Ambulatory Visit
Admission: RE | Admit: 2016-10-28 | Discharge: 2016-10-28 | Disposition: A | Discharge status: Home / Self Care | Payer: 59 | Source: Ambulatory Visit | Attending: Radiation Oncology | Admitting: Radiation Oncology

## 2016-10-28 ENCOUNTER — Ambulatory Visit: Payer: 59

## 2016-10-28 VITALS — BP 129/71 | HR 82 | Resp 18 | Wt 172.2 lb

## 2016-10-28 DIAGNOSIS — C499 Malignant neoplasm of connective and soft tissue, unspecified: Secondary | ICD-10-CM

## 2016-10-28 DIAGNOSIS — Z510 Encounter for antineoplastic radiation therapy: Secondary | ICD-10-CM | POA: Diagnosis not present

## 2016-10-28 NOTE — Progress Notes (Signed)
Vitals and weight stable. Denies pain. Reports tenderness continues in left thigh worse if she lays on the leg. Denies skin changes within treatment field. Reports using radiaplex as directed. Denies numbness, tingling or edema of left leg. Steady gait noted. Denies any bowel complaints. Reports mild fatigue. Reports prior to xrt tumor was palpable center to lateral aspect of left femur but, now only palpable in the lateral. Patient questions why this change.            BP 129/71 (BP Location: Left Arm, Patient Position: Sitting, Cuff Size: Large)   Pulse 82   Resp 18   Wt 172 lb 3.2 oz (78.1 kg)   SpO2 100%   BMI 26.18 kg/m  Wt Readings from Last 3 Encounters:  10/28/16 172 lb 3.2 oz (78.1 kg)  10/21/16 170 lb 12.8 oz (77.5 kg)  10/15/16 176 lb 9.6 oz (80.1 kg)

## 2016-10-28 NOTE — Progress Notes (Signed)
  Radiation Oncology         [phone]   Name: Veronica Mcclain MRN: [id number]   Date: 10/28/2016  DOB: July 26, 1951     Weekly Radiation Therapy Management    ICD-9-CM ICD-10-CM   1. Pleomorphic cell sarcoma (HCC) 171.9 C49.9     Current Dose: 18 Gy  Planned Dose:  45 Gy  Narrative The patient presents for routine under treatment assessment.  Weight and vitals stable. The patient denies pain, though she reports ongoing tenderness in the left thigh which is worse if she lays on the leg. She reports using Radiaplex as directed. The patient denies numbness, tingling, or edema of the left leg. She denies any bowel or bladder complaints. The patient reports that prior to radiation therapy the tumor was palpable at the center to lateral aspect of left the femur, but now is only palpable in the left lateral area. She questions why this has changed.   Set-up films were reviewed. The chart was checked.  Physical Findings  weight is 172 lb 3.2 oz (78.1 kg). Her blood pressure is 129/71 and her pulse is 82. Her respiration is 18 and oxygen saturation is 100%.  Alert and oriented x 4. In no acute distress.  Impression The patient is tolerating radiation.  Plan Continue treatment as planned. I discussed with the patient that the tumor has probably not actually moved, but that it is probably occupying its original anatomic position which is a positive sign.     Sheral Apley Tammi Klippel, M.D.  This document serves as a record of services personally performed by Tyler Pita, MD and Shona Simpson, PA-C. It was created on their behalf by Maryla Morrow, a trained medical scribe. The creation of this record is based on the scribe's personal observations and the provider's statements to them. This document has been checked and approved by the attending provider.

## 2016-10-29 ENCOUNTER — Ambulatory Visit: Payer: 59

## 2016-10-29 ENCOUNTER — Ambulatory Visit
Admission: RE | Admit: 2016-10-29 | Discharge: 2016-10-29 | Disposition: A | Discharge status: Home / Self Care | Payer: 59 | Source: Ambulatory Visit | Attending: Radiation Oncology | Admitting: Radiation Oncology

## 2016-10-29 DIAGNOSIS — Z510 Encounter for antineoplastic radiation therapy: Secondary | ICD-10-CM | POA: Diagnosis not present

## 2016-11-01 ENCOUNTER — Ambulatory Visit
Admission: RE | Admit: 2016-11-01 | Discharge: 2016-11-01 | Disposition: A | Discharge status: Home / Self Care | Payer: 59 | Source: Ambulatory Visit | Attending: Radiation Oncology | Admitting: Radiation Oncology

## 2016-11-01 ENCOUNTER — Ambulatory Visit: Payer: 59

## 2016-11-01 DIAGNOSIS — Z510 Encounter for antineoplastic radiation therapy: Secondary | ICD-10-CM | POA: Diagnosis not present

## 2016-11-02 ENCOUNTER — Ambulatory Visit
Admission: RE | Admit: 2016-11-02 | Discharge: 2016-11-02 | Disposition: A | Discharge status: Home / Self Care | Payer: 59 | Source: Ambulatory Visit | Attending: Radiation Oncology | Admitting: Radiation Oncology

## 2016-11-02 ENCOUNTER — Ambulatory Visit: Payer: 59

## 2016-11-02 DIAGNOSIS — Z510 Encounter for antineoplastic radiation therapy: Secondary | ICD-10-CM | POA: Diagnosis not present

## 2016-11-03 ENCOUNTER — Ambulatory Visit: Payer: 59

## 2016-11-03 ENCOUNTER — Ambulatory Visit
Admission: RE | Admit: 2016-11-03 | Discharge: 2016-11-03 | Disposition: A | Discharge status: Home / Self Care | Payer: 59 | Source: Ambulatory Visit | Attending: Radiation Oncology | Admitting: Radiation Oncology

## 2016-11-03 ENCOUNTER — Other Ambulatory Visit: Payer: Self-pay | Admitting: Radiation Oncology

## 2016-11-03 ENCOUNTER — Telehealth: Payer: Self-pay | Admitting: Radiation Oncology

## 2016-11-03 DIAGNOSIS — Z87440 Personal history of urinary (tract) infections: Secondary | ICD-10-CM

## 2016-11-03 DIAGNOSIS — R109 Unspecified abdominal pain: Secondary | ICD-10-CM

## 2016-11-03 DIAGNOSIS — Z510 Encounter for antineoplastic radiation therapy: Secondary | ICD-10-CM | POA: Diagnosis not present

## 2016-11-03 LAB — URINALYSIS, MICROSCOPIC - CHCC
Bilirubin (Urine): NEGATIVE
Blood: NEGATIVE
Glucose: NEGATIVE mg/dL
Ketones: NEGATIVE mg/dL
NITRITE: NEGATIVE
PROTEIN: NEGATIVE mg/dL
SPECIFIC GRAVITY, URINE: 1.02 (ref 1.003–1.035)
UROBILINOGEN UR: 0.2 mg/dL (ref 0.2–1)
pH: 5 (ref 4.6–8.0)

## 2016-11-03 MED ORDER — CIPROFLOXACIN HCL 500 MG PO TABS: 500.0000 mg | ORAL_TABLET | Freq: Two times a day (BID) | ORAL | 0 refills | Status: AC

## 2016-11-03 NOTE — Progress Notes (Signed)
Thank you. I will get this handled. She came around while you were seeing follow ups. I hope I didn't over step. Sam

## 2016-11-03 NOTE — Telephone Encounter (Signed)
-----   Message from Hayden Pedro, [place] sent at 11/03/2016 12:24 PM EST -----  I would try cipro 500 mg #10, one po BID for 5 days, no refills ----- Message ----- From: Interface, Lab In Three Zero One Sent: 11/03/2016  11:42 AM To: Hayden Pedro, PA-C

## 2016-11-03 NOTE — Telephone Encounter (Signed)
Phoned patient's home. Informed her it does appear she has a UTI. Explained I will be calling in Cipro 500 bid to [facility] in [place]. She verbalized understanding. Phoned [facility] as promised.

## 2016-11-04 ENCOUNTER — Ambulatory Visit: Payer: 59

## 2016-11-04 ENCOUNTER — Ambulatory Visit
Admission: RE | Admit: 2016-11-04 | Discharge: 2016-11-04 | Disposition: A | Discharge status: Home / Self Care | Payer: 59 | Source: Ambulatory Visit | Attending: Radiation Oncology | Admitting: Radiation Oncology

## 2016-11-04 DIAGNOSIS — Z510 Encounter for antineoplastic radiation therapy: Secondary | ICD-10-CM | POA: Diagnosis not present

## 2016-11-05 ENCOUNTER — Ambulatory Visit
Admission: RE | Admit: 2016-11-05 | Discharge: 2016-11-05 | Disposition: A | Discharge status: Home / Self Care | Payer: 59 | Source: Ambulatory Visit | Attending: Radiation Oncology | Admitting: Radiation Oncology

## 2016-11-05 ENCOUNTER — Ambulatory Visit: Payer: 59

## 2016-11-05 ENCOUNTER — Encounter: Payer: Self-pay | Admitting: Radiation Oncology

## 2016-11-05 VITALS — BP 130/82 | HR 91 | Temp 97.8°F | Resp 16 | Ht 68.0 in | Wt 171.8 lb

## 2016-11-05 DIAGNOSIS — Z510 Encounter for antineoplastic radiation therapy: Secondary | ICD-10-CM | POA: Diagnosis not present

## 2016-11-05 DIAGNOSIS — C499 Malignant neoplasm of connective and soft tissue, unspecified: Secondary | ICD-10-CM | POA: Diagnosis present

## 2016-11-05 LAB — URINE CULTURE

## 2016-11-05 MED ORDER — RADIAPLEXRX EX GEL
Freq: Once | CUTANEOUS | Status: AC
  Administered 2016-11-05: 12:00:00 via TOPICAL

## 2016-11-05 NOTE — Progress Notes (Signed)
Vitals and weight stable. Denies pain. Reports tenderness continues in left thigh worse if she lays on the leg. Reports redness to left thigh within treatment field. Reports using radiaplex as directed. Denies numbness, tingling or edema of left leg. Steady gait noted. Denies any bowel complaints. Reports mild fatigue. Reports prior to xrt tumor was palpable center to lateral aspect of left femur but, now only palpable in the lateral.  New tube of Radiaplex gel given today. Wt Readings from Last 3 Encounters:  11/05/16 171 lb 12.8 oz (77.9 kg)  10/28/16 172 lb 3.2 oz (78.1 kg)  10/21/16 170 lb 12.8 oz (77.5 kg)  BP 130/82   Pulse 91   Temp 97.8 F (36.6 C) (Oral)   Resp 16   Ht 5\' 8"  (1.727 m)   Wt 171 lb 12.8 oz (77.9 kg)   SpO2 100%   BMI 26.12 kg/m

## 2016-11-05 NOTE — Progress Notes (Signed)
  Radiation Oncology         [phone]   Name: Veronica Mcclain MRN: [id number]   Date: 11/05/2016  DOB: 1950/09/16     Weekly Radiation Therapy Management    ICD-9-CM ICD-10-CM   1. 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  Denies pain. Reports tenderness continues in left thigh and it is worse if she lays on the leg. Reports redness to the left thigh within the treatment field. Reports using radiaplex as directed. Denies numbness, tingling or edema of the left leg. Steady gait noted by the nurse. Denies any bowel complaints. Reports mild fatigue. Reports prior to xrt, the tumor was palpable in the center to lateral aspect of the left femur, but it is now only palpable in the lateral.  New tube of Radiaplex gel given today by the nurse.  Set-up films were reviewed. The chart was checked.  Physical Findings  height is 5\' 8"  (1.727 m) and weight is 171 lb 12.8 oz (77.9 kg). Her oral temperature is 97.8 F (36.6 C). Her blood pressure is 130/82 and her pulse is 91. Her respiration is 16 and oxygen saturation is 100%.  Alert and oriented x 4. In no acute distress.  Impression The patient is tolerating radiation.  Plan Continue treatment as planned.     Sheral Apley Tammi Klippel, M.D.  This document serves as a record of services personally performed by Tyler Pita, MD. It was created on his behalf by Darcus Austin, a trained medical scribe. The creation of this record is based on the scribe's personal observations and the provider's statements to them. This document has been checked and approved by the attending provider.

## 2016-11-08 ENCOUNTER — Ambulatory Visit: Payer: 59

## 2016-11-08 ENCOUNTER — Ambulatory Visit
Admission: RE | Admit: 2016-11-08 | Discharge: 2016-11-08 | Disposition: A | Discharge status: Home / Self Care | Payer: 59 | Source: Ambulatory Visit | Attending: Radiation Oncology | Admitting: Radiation Oncology

## 2016-11-08 DIAGNOSIS — Z510 Encounter for antineoplastic radiation therapy: Secondary | ICD-10-CM | POA: Diagnosis not present

## 2016-11-09 ENCOUNTER — Ambulatory Visit: Payer: 59

## 2016-11-09 ENCOUNTER — Ambulatory Visit
Admission: RE | Admit: 2016-11-09 | Discharge: 2016-11-09 | Disposition: A | Discharge status: Home / Self Care | Payer: 59 | Source: Ambulatory Visit | Attending: Radiation Oncology | Admitting: Radiation Oncology

## 2016-11-09 DIAGNOSIS — Z510 Encounter for antineoplastic radiation therapy: Secondary | ICD-10-CM | POA: Diagnosis not present

## 2016-11-10 ENCOUNTER — Ambulatory Visit: Payer: 59

## 2016-11-10 ENCOUNTER — Ambulatory Visit
Admission: RE | Admit: 2016-11-10 | Discharge: 2016-11-10 | Disposition: A | Discharge status: Home / Self Care | Payer: 59 | Source: Ambulatory Visit | Attending: Radiation Oncology | Admitting: Radiation Oncology

## 2016-11-10 DIAGNOSIS — Z510 Encounter for antineoplastic radiation therapy: Secondary | ICD-10-CM | POA: Diagnosis not present

## 2016-11-11 ENCOUNTER — Ambulatory Visit: Payer: 59

## 2016-11-11 ENCOUNTER — Ambulatory Visit
Admission: RE | Admit: 2016-11-11 | Discharge: 2016-11-11 | Disposition: A | Discharge status: Home / Self Care | Payer: 59 | Source: Ambulatory Visit | Attending: Radiation Oncology | Admitting: Radiation Oncology

## 2016-11-11 VITALS — BP 106/62 | HR 93 | Temp 98.9°F | Resp 18 | Wt 170.0 lb

## 2016-11-11 DIAGNOSIS — Z510 Encounter for antineoplastic radiation therapy: Secondary | ICD-10-CM | POA: Diagnosis not present

## 2016-11-11 DIAGNOSIS — C499 Malignant neoplasm of connective and soft tissue, unspecified: Secondary | ICD-10-CM

## 2016-11-11 NOTE — Progress Notes (Signed)
Weight and vitals stable. Denies pain. Reports continued tenderness left outer thigh. Reports hyperpigmentation without desquamation of left thigh. Reports using radiaplex as directed. Denies numbness, tingling or edema of left leg. Steady gait noted. Denies bowel and bladder complaints. Finished antibiotics for UTI but, still has mild low back pain. Reports in the past she sometimes require a second round of antibiotic for resolution. Tumor that was once palpable center of thigh to lateral aspect can now only be felt on lateral aspect of thigh.   BP 106/62 (BP Location: Left Arm, Patient Position: Sitting, Cuff Size: Normal)   Pulse 93   Temp 98.9 F (37.2 C) (Oral)   Resp 18   Wt 170 lb (77.1 kg)   SpO2 100%   BMI 25.85 kg/m  Wt Readings from Last 3 Encounters:  11/11/16 170 lb (77.1 kg)  11/05/16 171 lb 12.8 oz (77.9 kg)  10/28/16 172 lb 3.2 oz (78.1 kg)

## 2016-11-11 NOTE — Addendum Note (Signed)
Encounter addended by: Heywood Footman, RN on: 11/11/2016  1:54 PM<BR>    Actions taken: Sign clinical note

## 2016-11-11 NOTE — Progress Notes (Signed)
Delivered one month follow up appointment card to [facility] to provide to the patient. Appointment for follow up is set for 4/17 before surgery on 4/19 as ordered by Dr. Tammi Klippel.

## 2016-11-11 NOTE — Progress Notes (Signed)
  Radiation Oncology         [phone]   Name: Veronica Mcclain [place] MRN: [id number]   Date: 11/11/2016  DOB: December 06, 1950     Weekly Radiation Therapy Management    ICD-9-CM ICD-10-CM   1. 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  Weight and vitals stable. Denies pain. Reports tenderness continues in left outer thigh. Reports hyperpigmentation without desquamation within the treatment field. Reports using radiaplex as directed. Denies numbness, tingling or edema of the left leg. Steady gait noted by the nurse. Denies any bowel or bladder complaints. She reports completing her antibiotics for UTI but continues to complain of mild low back pain. She notes in the past, she sometimes required a second round of antibiotics for resolution. Reports prior to xrt, the tumor was palpable in the center to lateral aspect of the left femur, but it is now only palpable in the lateral.    Set-up films were reviewed. The chart was checked.  Physical Findings  weight is 170 lb (77.1 kg). Her oral temperature is 98.9 F (37.2 C). Her blood pressure is 106/62 and her pulse is 93. Her respiration is 18 and oxygen saturation is 100%.  Alert and oriented x 4. In no acute distress.  Impression The patient is tolerating radiation.  Plan Continue treatment as planned.     Sheral Apley Tammi Klippel, M.D.  This document serves as a record of services personally performed by Tyler Pita, MD. It was created on his behalf by Bethann Humble, a trained medical scribe. The creation of this record is based on the scribe's personal observations and the provider's statements to them. This document has been checked and approved by the attending provider.

## 2016-11-12 ENCOUNTER — Ambulatory Visit: Payer: 59

## 2016-11-12 ENCOUNTER — Ambulatory Visit
Admission: RE | Admit: 2016-11-12 | Discharge: 2016-11-12 | Disposition: A | Discharge status: Home / Self Care | Payer: 59 | Source: Ambulatory Visit | Attending: Radiation Oncology | Admitting: Radiation Oncology

## 2016-11-12 DIAGNOSIS — Z510 Encounter for antineoplastic radiation therapy: Secondary | ICD-10-CM | POA: Diagnosis not present

## 2016-11-15 ENCOUNTER — Ambulatory Visit
Admission: RE | Admit: 2016-11-15 | Discharge: 2016-11-15 | Disposition: A | Discharge status: Home / Self Care | Payer: 59 | Source: Ambulatory Visit | Attending: Radiation Oncology | Admitting: Radiation Oncology

## 2016-11-15 ENCOUNTER — Ambulatory Visit: Payer: 59

## 2016-11-15 DIAGNOSIS — Z510 Encounter for antineoplastic radiation therapy: Secondary | ICD-10-CM | POA: Diagnosis not present

## 2016-11-16 ENCOUNTER — Ambulatory Visit
Admission: RE | Admit: 2016-11-16 | Discharge: 2016-11-16 | Disposition: A | Discharge status: Home / Self Care | Payer: 59 | Source: Ambulatory Visit | Attending: Radiation Oncology | Admitting: Radiation Oncology

## 2016-11-16 ENCOUNTER — Ambulatory Visit: Payer: 59

## 2016-11-16 DIAGNOSIS — Z510 Encounter for antineoplastic radiation therapy: Secondary | ICD-10-CM | POA: Diagnosis not present

## 2016-11-17 ENCOUNTER — Ambulatory Visit: Payer: 59

## 2016-11-17 ENCOUNTER — Encounter: Payer: Self-pay | Admitting: Radiation Oncology

## 2016-11-17 ENCOUNTER — Ambulatory Visit
Admission: RE | Admit: 2016-11-17 | Discharge: 2016-11-17 | Disposition: A | Discharge status: Home / Self Care | Payer: 59 | Source: Ambulatory Visit | Attending: Radiation Oncology | Admitting: Radiation Oncology

## 2016-11-17 DIAGNOSIS — Z510 Encounter for antineoplastic radiation therapy: Secondary | ICD-10-CM | POA: Diagnosis not present

## 2016-11-17 NOTE — Progress Notes (Signed)
  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 During treatment, the patient reported tenderness in left outer thigh. There was hyperpigmentation without desquamation noted in the treatment field.  Plan: The patient has completed radiation treatment. The patient will return to radiation oncology clinic for routine followup in one month. I advised her to call or return sooner if she has any questions or concerns related to her recovery or treatment. ________________________________  Sheral Apley. Tammi Klippel, M.D.   This document serves as a record of services personally performed by Tyler Pita, MD. It was created on his behalf by Bethann Humble, a trained medical scribe. The creation of this record is based on the scribe's personal observations and the provider's statements to them. This document has been checked and approved by the attending provider.

## 2016-11-18 ENCOUNTER — Ambulatory Visit: Payer: 59

## 2016-11-19 ENCOUNTER — Ambulatory Visit: Payer: 59

## 2016-11-22 ENCOUNTER — Ambulatory Visit: Payer: 59

## 2016-11-23 ENCOUNTER — Ambulatory Visit: Payer: 59

## 2016-11-24 ENCOUNTER — Ambulatory Visit: Payer: 59

## 2016-12-13 DIAGNOSIS — C4922 Malignant neoplasm of connective and soft tissue of left lower limb, including hip: Secondary | ICD-10-CM | POA: Diagnosis not present

## 2016-12-13 DIAGNOSIS — Z923 Personal history of irradiation: Secondary | ICD-10-CM | POA: Diagnosis not present

## 2016-12-13 DIAGNOSIS — Z9221 Personal history of antineoplastic chemotherapy: Secondary | ICD-10-CM | POA: Diagnosis not present

## 2016-12-14 DIAGNOSIS — C4922 Malignant neoplasm of connective and soft tissue of left lower limb, including hip: Secondary | ICD-10-CM | POA: Diagnosis not present

## 2016-12-24 DIAGNOSIS — M150 Primary generalized (osteo)arthritis: Secondary | ICD-10-CM | POA: Diagnosis not present

## 2016-12-24 DIAGNOSIS — E663 Overweight: Secondary | ICD-10-CM | POA: Diagnosis not present

## 2016-12-24 DIAGNOSIS — Z6824 Body mass index (BMI) 24.0-24.9, adult: Secondary | ICD-10-CM | POA: Diagnosis not present

## 2016-12-24 DIAGNOSIS — M792 Neuralgia and neuritis, unspecified: Secondary | ICD-10-CM | POA: Diagnosis not present

## 2016-12-27 DIAGNOSIS — C4922 Malignant neoplasm of connective and soft tissue of left lower limb, including hip: Secondary | ICD-10-CM | POA: Diagnosis not present

## 2016-12-27 DIAGNOSIS — R918 Other nonspecific abnormal finding of lung field: Secondary | ICD-10-CM | POA: Diagnosis not present

## 2016-12-27 NOTE — Progress Notes (Addendum)
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  Weight and vital signs are stable. Pain:None, having surgery on Thursday, 12-30-16 to the left leg to remove the cancer at [facility] by Dr. Warren Lacy  Fatigue:Reports she is not having fatigue, able to do her daily routine. Bowel/Bladder:None,having normal bowel movements and voiding  Around every two hours. Appetite:Good eating two meals daily Weight: Wt Readings from Last 3 Encounters:  12/28/16 167 lb (75.8 kg)  11/11/16 170 lb (77.1 kg)  11/05/16 171 lb 12.8 oz (77.9 kg)  BP 107/73   Pulse 88   Temp 98.7 F (37.1 C) (Oral)   Resp 18   Ht 5\' 8"  (1.727 m)   Wt 167 lb (75.8 kg)   SpO2 98%   BMI 25.39 kg/m

## 2016-12-28 ENCOUNTER — Encounter: Payer: Self-pay | Admitting: Radiation Oncology

## 2016-12-28 ENCOUNTER — Inpatient Hospital Stay: Admission: RE | Admit: 2016-12-28 | Payer: Self-pay | Source: Ambulatory Visit | Admitting: Radiation Oncology

## 2016-12-28 ENCOUNTER — Ambulatory Visit
Admission: RE | Admit: 2016-12-28 | Discharge: 2016-12-28 | Disposition: A | Discharge status: Home / Self Care | Payer: Medicare Other | Source: Ambulatory Visit | Attending: Radiation Oncology | Admitting: Radiation Oncology

## 2016-12-28 VITALS — BP 107/73 | HR 88 | Temp 98.7°F | Resp 18 | Ht 68.0 in | Wt 167.0 lb

## 2016-12-28 DIAGNOSIS — Z882 Allergy status to sulfonamides status: Secondary | ICD-10-CM | POA: Diagnosis not present

## 2016-12-28 DIAGNOSIS — C499 Malignant neoplasm of connective and soft tissue, unspecified: Secondary | ICD-10-CM

## 2016-12-28 DIAGNOSIS — Z91040 Latex allergy status: Secondary | ICD-10-CM | POA: Diagnosis not present

## 2016-12-28 DIAGNOSIS — C4922 Malignant neoplasm of connective and soft tissue of left lower limb, including hip: Secondary | ICD-10-CM | POA: Diagnosis not present

## 2016-12-28 NOTE — Addendum Note (Signed)
Encounter addended by: Malena Edman, RN on: 12/28/2016  2:44 PM<BR>    Actions taken: Charge Capture section accepted

## 2016-12-28 NOTE — Progress Notes (Signed)
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  Interval Since Last Radiation:  6 weeks  10/14/16- 11/17/16- Curative-pre-operative: Left thigh sarcoma / 45 Gy in 25 fractions  Narrative:  The patient returns today for routine follow-up.  She tolerated radiotherapy well with only mild tenderness in the left thigh during treatment. She had some mild hyperpigmentation of the left thigh without desquamation.     She previously completed 3 cycles of Gemcitabine and docetaxel under the direction and care of Dr. Merrie Roof at [facility].  Restaging MRI on 09/21/2016 revealed persistence of the mass, unchanged in size measuring 9.9 x 4.7 x 12.9 cm. She is scheduled for surgical excision of the left thigh mass with orthopedics at [facility], Dr. Mylo Red on 12/30/16.                    On review of systems, the patient states that the pain in the left thigh has almost completely resolved and is currently without complaints.  Overall, she feels great. She denies fatigue, decreased appetite, fever, chills, N/V/D.  She has not had any numbness or tingling in the LLE and denies difficulty with ambulation. She reports a healthy appetite and is maintaining her weight.  BMs are normal consistency and occur regularly. She denies shortness of breath, dypnea, night sweats or chest pain.  ALLERGIES:  is allergic to latex and sulfa antibiotics.  Meds: Current Outpatient Prescriptions  Medication Sig Dispense Refill  . atorvastatin (LIPITOR) 80 MG tablet TAKE 1 TABLET BY MOUTH EVERY DAY 90 tablet 2  . Cholecalciferol (VITAMIN D) 1000 UNITS capsule Take 1,000 Units by mouth daily. OCCASIONALLY     . cyclobenzaprine (FLEXERIL) 5 MG tablet TAKE 1 TABLET 3 TIMES A DAY AS  NEEDED FOR MUSCLE SPASMS 30 tablet 0  . escitalopram (LEXAPRO) 10 MG tablet Take 20 mg by mouth daily.     Marland Kitchen gabapentin (NEURONTIN) 600 MG tablet     . HYDROcodone-acetaminophen (NORCO) 10-325 MG tablet TAKE 1 TO 2 TABLETS BY MOUTH EVERY DAY AS NEEDED FOR PAIN  0  . KLOR-CON M20 20 MEQ tablet TAKE 1 TABLET BY MOUTH EVERY DAY 90 tablet 0  . pantoprazole (PROTONIX) 40 MG tablet TAKE 1 TABLET BY MOUTH EVERY DAY 90 tablet 0  . promethazine (PHENERGAN) 25 MG tablet Take 1 tablet (25 mg total) by mouth every 6 (six) hours as needed for nausea. (Patient not taking: Reported on 10/21/2016) 30 tablet 0   No current facility-administered medications for this encounter.     Physical Findings:  height is 5\' 8"  (1.727 m) and weight is 167 lb (75.8 kg). Her oral temperature is 98.7 F (37.1 C). Her blood pressure is 107/73 and her pulse is 88. Her respiration is 18 and oxygen saturation is 98%.  Pain Assessment Pain Score: 0-No pain/10 In general this is a well appearing caucasian female in no acute distress. She's alert and oriented x4 and appropriate throughout the examination. Cardiopulmonary assessment is negative for acute distress and she exhibits normal effort. Examination of the left thigh reveals only mild residual hyperpigmentation of the left thigh with skin intact and without signs of infection.  There remains a palpable mass, approximately 10cm in greatest dimension,  within the lateral aspect of the left mid-thigh.  She has full ROM of LLE.  Sensation and strength are intact and equal in bilateral LEs.  Lab Findings: Lab Results  Component Value Date   WBC 6.9 12/02/2011   HGB 13.4 12/02/2011   HCT 39.5 12/02/2011   MCV 86.6 12/02/2011   PLT 295 12/02/2011     Radiographic Findings: No results found.  Impression/Plan: 1. 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  She has recovered very nicely from the effects of radiotherapy. She is planning to  undergo surgical excision of the left thigh sarcoma at [facility] on 12/30/16 under the care of Dr. Mylo Red.  At this point, she will continue close follow-up with her medical oncologist, Dr. Merrie Roof at [facility] as well as her orthopedic surgeon, Dr. Mylo Red. She has planned follow up with Dr. Merrie Roof 1 month postop.  We are happy to continue to participate in her care as needed. She is advised to call at any time with questions or concerns related to her previous radiotherapy.    Nicholos Johns, PA-C

## 2016-12-30 DIAGNOSIS — E785 Hyperlipidemia, unspecified: Secondary | ICD-10-CM | POA: Diagnosis present

## 2016-12-30 DIAGNOSIS — I119 Hypertensive heart disease without heart failure: Secondary | ICD-10-CM | POA: Diagnosis present

## 2016-12-30 DIAGNOSIS — G8918 Other acute postprocedural pain: Secondary | ICD-10-CM | POA: Diagnosis not present

## 2016-12-30 DIAGNOSIS — C4922 Malignant neoplasm of connective and soft tissue of left lower limb, including hip: Secondary | ICD-10-CM | POA: Diagnosis present

## 2016-12-30 DIAGNOSIS — E7800 Pure hypercholesterolemia, unspecified: Secondary | ICD-10-CM | POA: Diagnosis present

## 2016-12-30 DIAGNOSIS — Z87891 Personal history of nicotine dependence: Secondary | ICD-10-CM | POA: Diagnosis not present

## 2016-12-30 DIAGNOSIS — Z9221 Personal history of antineoplastic chemotherapy: Secondary | ICD-10-CM | POA: Diagnosis not present

## 2016-12-30 DIAGNOSIS — K219 Gastro-esophageal reflux disease without esophagitis: Secondary | ICD-10-CM | POA: Diagnosis present

## 2017-01-05 DIAGNOSIS — Z483 Aftercare following surgery for neoplasm: Secondary | ICD-10-CM | POA: Diagnosis not present

## 2017-01-05 DIAGNOSIS — C4922 Malignant neoplasm of connective and soft tissue of left lower limb, including hip: Secondary | ICD-10-CM | POA: Diagnosis not present

## 2017-01-05 DIAGNOSIS — I119 Hypertensive heart disease without heart failure: Secondary | ICD-10-CM | POA: Diagnosis not present

## 2017-01-07 DIAGNOSIS — C4922 Malignant neoplasm of connective and soft tissue of left lower limb, including hip: Secondary | ICD-10-CM | POA: Diagnosis not present

## 2017-01-10 DIAGNOSIS — C4922 Malignant neoplasm of connective and soft tissue of left lower limb, including hip: Secondary | ICD-10-CM | POA: Diagnosis present

## 2017-01-10 DIAGNOSIS — E7800 Pure hypercholesterolemia, unspecified: Secondary | ICD-10-CM | POA: Diagnosis present

## 2017-01-10 DIAGNOSIS — G8918 Other acute postprocedural pain: Secondary | ICD-10-CM | POA: Diagnosis present

## 2017-01-10 DIAGNOSIS — Z87891 Personal history of nicotine dependence: Secondary | ICD-10-CM | POA: Diagnosis not present

## 2017-01-10 DIAGNOSIS — M79652 Pain in left thigh: Secondary | ICD-10-CM | POA: Diagnosis present

## 2017-01-10 DIAGNOSIS — Z9221 Personal history of antineoplastic chemotherapy: Secondary | ICD-10-CM | POA: Diagnosis not present

## 2017-01-13 ENCOUNTER — Other Ambulatory Visit (HOSPITAL_COMMUNITY): Payer: Self-pay | Admitting: Orthopedic Surgery

## 2017-01-13 DIAGNOSIS — C4922 Malignant neoplasm of connective and soft tissue of left lower limb, including hip: Secondary | ICD-10-CM

## 2017-01-13 DIAGNOSIS — M79652 Pain in left thigh: Secondary | ICD-10-CM

## 2017-01-15 ENCOUNTER — Ambulatory Visit (HOSPITAL_COMMUNITY)
Admission: RE | Admit: 2017-01-15 | Discharge: 2017-01-15 | Disposition: A | Discharge status: Home / Self Care | Payer: Medicare Other | Source: Ambulatory Visit | Attending: Orthopedic Surgery | Admitting: Orthopedic Surgery

## 2017-01-15 DIAGNOSIS — M79652 Pain in left thigh: Secondary | ICD-10-CM | POA: Diagnosis not present

## 2017-01-15 DIAGNOSIS — C499 Malignant neoplasm of connective and soft tissue, unspecified: Secondary | ICD-10-CM | POA: Diagnosis not present

## 2017-01-15 DIAGNOSIS — R609 Edema, unspecified: Secondary | ICD-10-CM | POA: Insufficient documentation

## 2017-01-15 DIAGNOSIS — C7652 Malignant neoplasm of left lower limb: Secondary | ICD-10-CM | POA: Diagnosis not present

## 2017-01-15 DIAGNOSIS — C4922 Malignant neoplasm of connective and soft tissue of left lower limb, including hip: Secondary | ICD-10-CM

## 2017-01-15 LAB — POCT I-STAT CREATININE: CREATININE: 0.7 mg/dL (ref 0.44–1.00)

## 2017-01-15 IMAGING — MR MR FEMUR*L* WO/W CM
9 series · 40 of 40 positions shown · IV contrast (multihance)
Comparison: None.

CLINICAL DATA: Left thigh pain. Soft tissue sarcoma of the left
thigh surgically removed 2.5 weeks ago.

EXAM:
MR OF THE LEFT LOWER EXTREMITY WITHOUT AND WITH CONTRAST
TECHNIQUE: Multiplanar, multisequence MR imaging of the left thigh was
performed both before and after administration of intravenous
contrast.
CONTRAST:  15mL MULTIHANCE GADOBENATE DIMEGLUMINE 529 MG/ML IV SOLN

[Series 3: T1 · axial · 6.0mm · 0.98mm/px · z∈[-263,+241]mm · 6 of 64 slices shown]
[im 1/64]
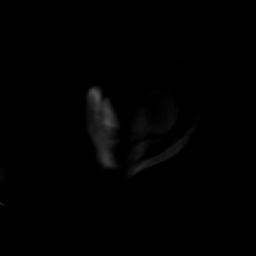
[im 13/64]
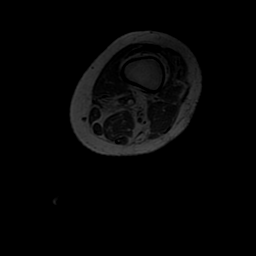
[im 26/64]
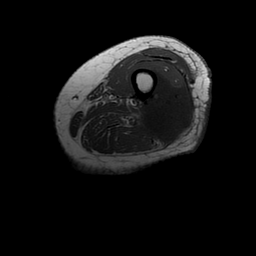
[im 38/64]
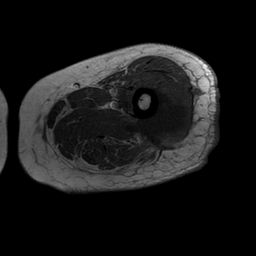
[im 51/64]
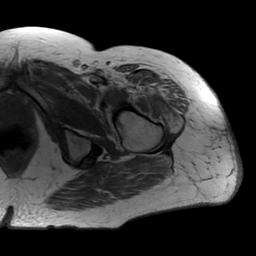
[im 64/64]
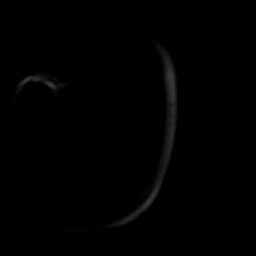

[Series 4: T2 fat-sat · axial · 6.0mm · 0.98mm/px · z∈[-263,+241]mm · 5 of 64 slices shown (1 of 2)]
[im 1/64]
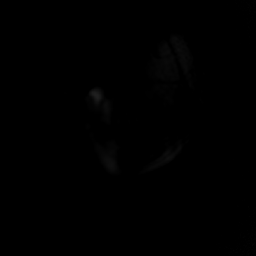
[im 16/64]
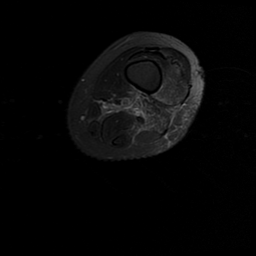
[im 32/64]
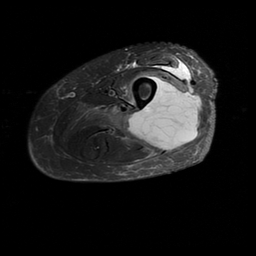
[im 48/64]
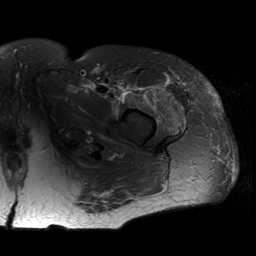
[im 64/64]
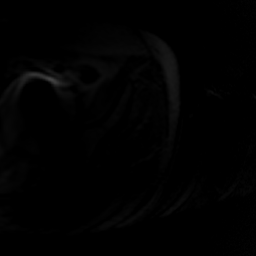

[Series 5: T1 fat-sat · axial · 6.0mm · 0.98mm/px · z∈[-263,+241]mm · 5 of 64 slices shown]
[im 1/64]
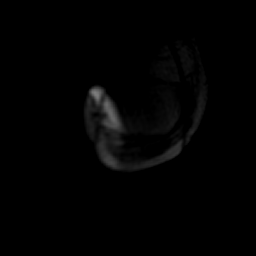
[im 16/64]
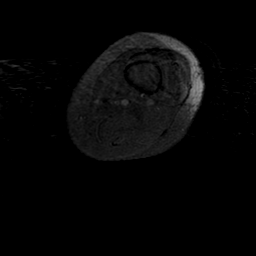
[im 32/64]
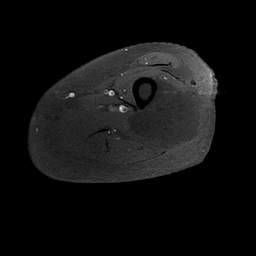
[im 48/64]
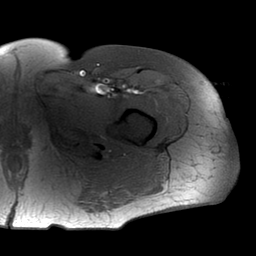
[im 64/64]
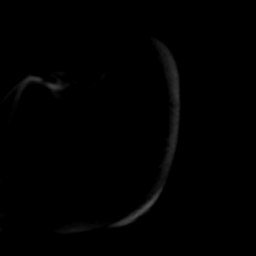

[Series 6: cor/ (id) · coronal · 5.0mm · 1.88mm/px · 4 of 42 slices shown]
[im 1/42]
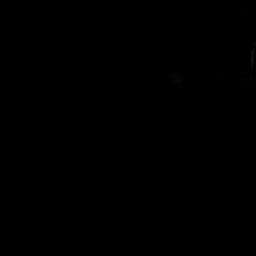
[im 14/42]
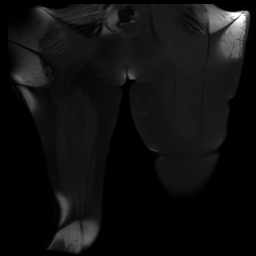
[im 28/42]
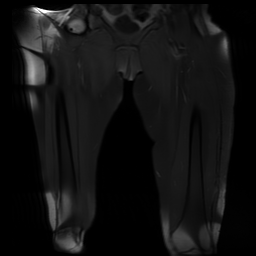
[im 42/42]
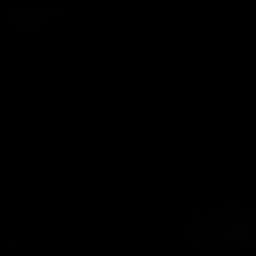

[Series 7: T2 fat-sat · coronal · 5.0mm · 1.88mm/px · 4 of 42 slices shown (2 of 2)]
[im 1/42]
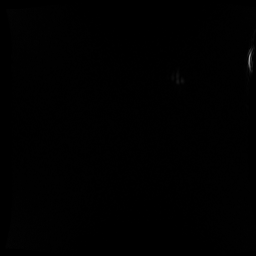
[im 14/42]
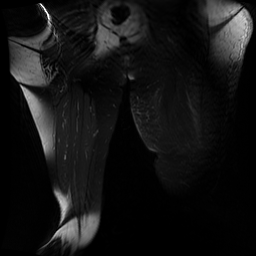
[im 28/42]
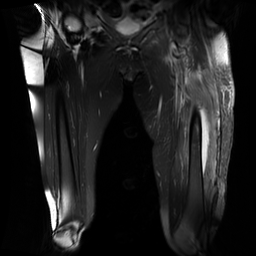
[im 42/42]
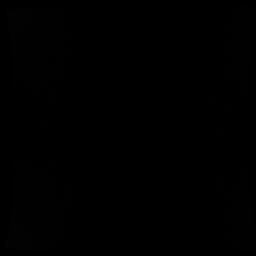

[Series 8: sag fse ir · sagittal · 5.0mm · 1.88mm/px · 4 of 47 slices shown]
[im 1/47]
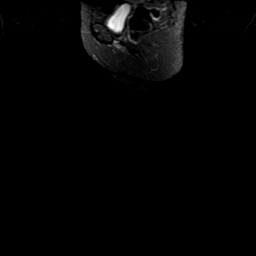
[im 16/47]
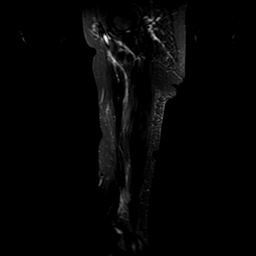
[im 31/47]
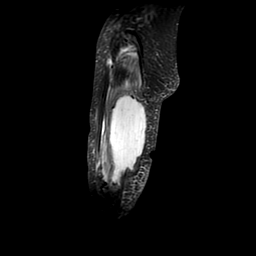
[im 47/47]
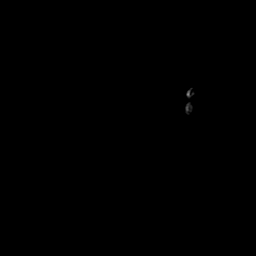

[Series 9: T1 post-contrast · coronal · 5.0mm · 1.88mm/px · 4 of 42 slices shown (1 of 2)]
[im 1/42]
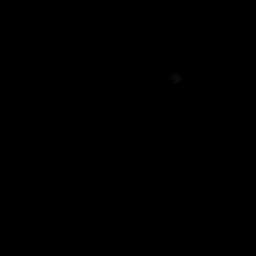
[im 14/42]
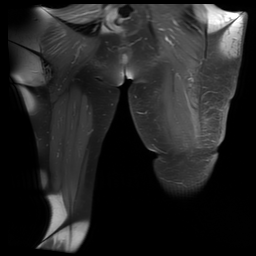
[im 28/42]
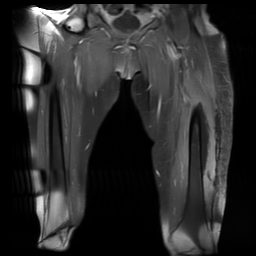
[im 42/42]
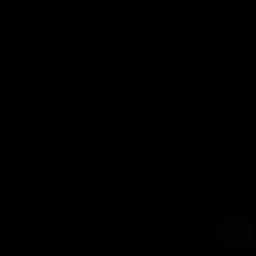

[Series 10: T1 fat-sat post-contrast · axial · 6.0mm · 0.98mm/px · z∈[-263,+241]mm · 5 of 64 slices shown]
[im 1/64]
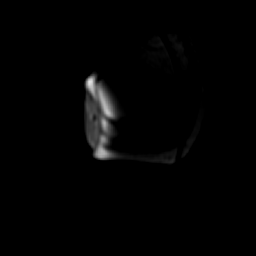
[im 16/64]
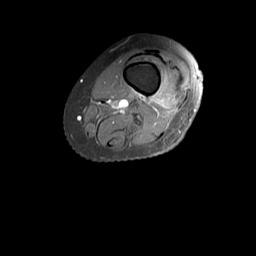
[im 32/64]
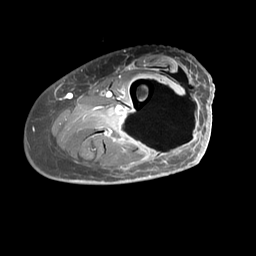
[im 48/64]
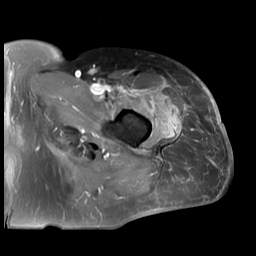
[im 64/64]
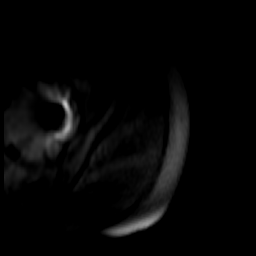

[Series 11: T1 post-contrast · sagittal · 5.0mm · 1.88mm/px · 3 of 41 slices shown (2 of 2)]
[im 1/41]
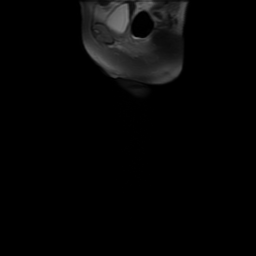
[im 21/41]
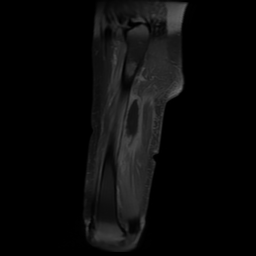
[im 41/41]
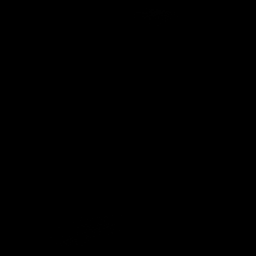

[40 of 40 positions shown; findings below may reference images not displayed]

FINDINGS: Bones/Joint/Cartilage

Mild marrow edema in the proximal femoral diaphysis likely related
to recent radiation therapy. No bone destruction or periosteal
reaction. No fracture or dislocation. Normal alignment. No joint
effusion.

Muscles and Tendons
Muscle edema in the left vastus intermedius, vastus lateralis hand
adductor magnus muscles likely reflecting postradiation myositis.
There has been extensive resection of the left vastus lateralis with
a 8 x 6 x 16.2 cm complex fluid collection with multiple thin non
enhancing septations within the surgical bed at the level of the mid
femoral diaphysis. There is mild peripheral enhancement. This likely
reflects a postoperative seroma versus a liquified hematoma.

Soft tissue
No other fluid collection or hematoma. No soft tissue mass to
suggest residual neoplasm.
IMPRESSION: 1. Muscle edema in the left vastus intermedius, vastus lateralis
hand adductor magnus muscles likely reflecting postradiation
myositis. There has been extensive resection of the left vastus
lateralis with a 8 x 6 x 16.2 cm complex fluid collection with
multiple thin non enhancing septations within the surgical bed at
the level of the mid femoral diaphysis. There is mild peripheral
enhancement. This likely reflects a postoperative seroma versus a
liquified hematoma.

## 2017-01-15 MED ORDER — GADOBENATE DIMEGLUMINE 529 MG/ML IV SOLN
15.0000 mL | Freq: Once | INTRAVENOUS | Status: AC | PRN
  Administered 2017-01-15: 15 mL via INTRAVENOUS

## 2017-01-17 DIAGNOSIS — Z483 Aftercare following surgery for neoplasm: Secondary | ICD-10-CM | POA: Diagnosis not present

## 2017-01-17 DIAGNOSIS — C4922 Malignant neoplasm of connective and soft tissue of left lower limb, including hip: Secondary | ICD-10-CM | POA: Diagnosis not present

## 2017-01-17 DIAGNOSIS — I119 Hypertensive heart disease without heart failure: Secondary | ICD-10-CM | POA: Diagnosis not present

## 2017-01-18 DIAGNOSIS — Z7982 Long term (current) use of aspirin: Secondary | ICD-10-CM | POA: Diagnosis not present

## 2017-01-18 DIAGNOSIS — C4922 Malignant neoplasm of connective and soft tissue of left lower limb, including hip: Secondary | ICD-10-CM | POA: Diagnosis not present

## 2017-01-18 DIAGNOSIS — Z4682 Encounter for fitting and adjustment of non-vascular catheter: Secondary | ICD-10-CM | POA: Diagnosis not present

## 2017-01-18 DIAGNOSIS — Z882 Allergy status to sulfonamides status: Secondary | ICD-10-CM | POA: Diagnosis not present

## 2017-01-18 DIAGNOSIS — E785 Hyperlipidemia, unspecified: Secondary | ICD-10-CM | POA: Diagnosis not present

## 2017-01-18 DIAGNOSIS — I119 Hypertensive heart disease without heart failure: Secondary | ICD-10-CM | POA: Diagnosis not present

## 2017-01-18 DIAGNOSIS — E7800 Pure hypercholesterolemia, unspecified: Secondary | ICD-10-CM | POA: Diagnosis not present

## 2017-01-18 DIAGNOSIS — Z79899 Other long term (current) drug therapy: Secondary | ICD-10-CM | POA: Diagnosis not present

## 2017-01-25 DIAGNOSIS — I119 Hypertensive heart disease without heart failure: Secondary | ICD-10-CM | POA: Diagnosis not present

## 2017-01-25 DIAGNOSIS — Z483 Aftercare following surgery for neoplasm: Secondary | ICD-10-CM | POA: Diagnosis not present

## 2017-01-25 DIAGNOSIS — C4922 Malignant neoplasm of connective and soft tissue of left lower limb, including hip: Secondary | ICD-10-CM | POA: Diagnosis not present

## 2017-01-26 DIAGNOSIS — R110 Nausea: Secondary | ICD-10-CM | POA: Diagnosis not present

## 2017-01-26 DIAGNOSIS — Z85831 Personal history of malignant neoplasm of soft tissue: Secondary | ICD-10-CM | POA: Diagnosis not present

## 2017-01-26 DIAGNOSIS — E785 Hyperlipidemia, unspecified: Secondary | ICD-10-CM | POA: Diagnosis not present

## 2017-01-26 DIAGNOSIS — I10 Essential (primary) hypertension: Secondary | ICD-10-CM | POA: Diagnosis not present

## 2017-01-26 DIAGNOSIS — Z9889 Other specified postprocedural states: Secondary | ICD-10-CM | POA: Diagnosis not present

## 2017-01-26 DIAGNOSIS — R52 Pain, unspecified: Secondary | ICD-10-CM | POA: Diagnosis not present

## 2017-01-26 DIAGNOSIS — I509 Heart failure, unspecified: Secondary | ICD-10-CM | POA: Diagnosis not present

## 2017-01-26 DIAGNOSIS — M542 Cervicalgia: Secondary | ICD-10-CM | POA: Diagnosis not present

## 2017-02-01 DIAGNOSIS — Z9221 Personal history of antineoplastic chemotherapy: Secondary | ICD-10-CM | POA: Diagnosis not present

## 2017-02-01 DIAGNOSIS — M79652 Pain in left thigh: Secondary | ICD-10-CM | POA: Diagnosis not present

## 2017-02-01 DIAGNOSIS — C4922 Malignant neoplasm of connective and soft tissue of left lower limb, including hip: Secondary | ICD-10-CM | POA: Diagnosis not present

## 2017-02-01 DIAGNOSIS — Z9889 Other specified postprocedural states: Secondary | ICD-10-CM | POA: Diagnosis not present

## 2017-02-01 DIAGNOSIS — Z87891 Personal history of nicotine dependence: Secondary | ICD-10-CM | POA: Diagnosis not present

## 2017-02-01 DIAGNOSIS — I10 Essential (primary) hypertension: Secondary | ICD-10-CM | POA: Diagnosis not present

## 2017-02-01 DIAGNOSIS — Z7982 Long term (current) use of aspirin: Secondary | ICD-10-CM | POA: Diagnosis not present

## 2017-02-01 DIAGNOSIS — Z79899 Other long term (current) drug therapy: Secondary | ICD-10-CM | POA: Diagnosis not present

## 2017-02-01 DIAGNOSIS — E7800 Pure hypercholesterolemia, unspecified: Secondary | ICD-10-CM | POA: Diagnosis not present

## 2017-02-01 DIAGNOSIS — Z882 Allergy status to sulfonamides status: Secondary | ICD-10-CM | POA: Diagnosis not present

## 2017-03-24 DIAGNOSIS — Z6822 Body mass index (BMI) 22.0-22.9, adult: Secondary | ICD-10-CM | POA: Diagnosis not present

## 2017-03-24 DIAGNOSIS — M150 Primary generalized (osteo)arthritis: Secondary | ICD-10-CM | POA: Diagnosis not present

## 2017-03-24 DIAGNOSIS — M792 Neuralgia and neuritis, unspecified: Secondary | ICD-10-CM | POA: Diagnosis not present

## 2017-04-10 ENCOUNTER — Emergency Department (HOSPITAL_BASED_OUTPATIENT_CLINIC_OR_DEPARTMENT_OTHER): Payer: Medicare Other

## 2017-04-10 ENCOUNTER — Encounter (HOSPITAL_BASED_OUTPATIENT_CLINIC_OR_DEPARTMENT_OTHER): Payer: Self-pay | Admitting: Emergency Medicine

## 2017-04-10 ENCOUNTER — Emergency Department (HOSPITAL_BASED_OUTPATIENT_CLINIC_OR_DEPARTMENT_OTHER)
Admission: EM | Admit: 2017-04-10 | Discharge: 2017-04-10 | Disposition: A | Discharge status: Home / Self Care | Payer: Medicare Other | Attending: Emergency Medicine | Admitting: Emergency Medicine

## 2017-04-10 DIAGNOSIS — L309 Dermatitis, unspecified: Secondary | ICD-10-CM

## 2017-04-10 DIAGNOSIS — L0390 Cellulitis, unspecified: Secondary | ICD-10-CM | POA: Diagnosis not present

## 2017-04-10 DIAGNOSIS — Z87891 Personal history of nicotine dependence: Secondary | ICD-10-CM | POA: Diagnosis not present

## 2017-04-10 DIAGNOSIS — Z91040 Latex allergy status: Secondary | ICD-10-CM | POA: Insufficient documentation

## 2017-04-10 DIAGNOSIS — Z79899 Other long term (current) drug therapy: Secondary | ICD-10-CM | POA: Diagnosis not present

## 2017-04-10 DIAGNOSIS — I119 Hypertensive heart disease without heart failure: Secondary | ICD-10-CM | POA: Diagnosis not present

## 2017-04-10 DIAGNOSIS — R55 Syncope and collapse: Secondary | ICD-10-CM | POA: Diagnosis not present

## 2017-04-10 DIAGNOSIS — R531 Weakness: Secondary | ICD-10-CM | POA: Insufficient documentation

## 2017-04-10 DIAGNOSIS — Z85831 Personal history of malignant neoplasm of soft tissue: Secondary | ICD-10-CM | POA: Insufficient documentation

## 2017-04-10 DIAGNOSIS — M542 Cervicalgia: Secondary | ICD-10-CM | POA: Diagnosis not present

## 2017-04-10 LAB — BASIC METABOLIC PANEL
Anion gap: 11 (ref 5–15)
BUN: 10 mg/dL (ref 6–20)
CALCIUM: 9.1 mg/dL (ref 8.9–10.3)
CO2: 25 mmol/L (ref 22–32)
CREATININE: 0.83 mg/dL (ref 0.44–1.00)
Chloride: 100 mmol/L — ABNORMAL LOW (ref 101–111)
Glucose, Bld: 148 mg/dL — ABNORMAL HIGH (ref 65–99)
Potassium: 3.6 mmol/L (ref 3.5–5.1)
SODIUM: 136 mmol/L (ref 135–145)

## 2017-04-10 LAB — CBC
HCT: 36.4 % (ref 36.0–46.0)
Hemoglobin: 11.8 g/dL — ABNORMAL LOW (ref 12.0–15.0)
MCH: 27.3 pg (ref 26.0–34.0)
MCHC: 32.4 g/dL (ref 30.0–36.0)
MCV: 84.1 fL (ref 78.0–100.0)
PLATELETS: 243 10*3/uL (ref 150–400)
RBC: 4.33 MIL/uL (ref 3.87–5.11)
RDW: 17.9 % — AB (ref 11.5–15.5)
WBC: 8.9 10*3/uL (ref 4.0–10.5)

## 2017-04-10 LAB — URINALYSIS, MICROSCOPIC (REFLEX)

## 2017-04-10 LAB — URINALYSIS, ROUTINE W REFLEX MICROSCOPIC
BILIRUBIN URINE: NEGATIVE
Glucose, UA: NEGATIVE mg/dL
HGB URINE DIPSTICK: NEGATIVE
KETONES UR: NEGATIVE mg/dL
Nitrite: NEGATIVE
Protein, ur: NEGATIVE mg/dL
Specific Gravity, Urine: 1.015 (ref 1.005–1.030)
pH: 5.5 (ref 5.0–8.0)

## 2017-04-10 LAB — CBG MONITORING, ED: GLUCOSE-CAPILLARY: 146 mg/dL — AB (ref 65–99)

## 2017-04-10 LAB — HEPATIC FUNCTION PANEL
ALK PHOS: 95 U/L (ref 38–126)
ALT: 18 U/L (ref 14–54)
AST: 30 U/L (ref 15–41)
Albumin: 3.5 g/dL (ref 3.5–5.0)
BILIRUBIN DIRECT: 0.1 mg/dL (ref 0.1–0.5)
BILIRUBIN INDIRECT: 0.6 mg/dL (ref 0.3–0.9)
TOTAL PROTEIN: 6.9 g/dL (ref 6.5–8.1)
Total Bilirubin: 0.7 mg/dL (ref 0.3–1.2)

## 2017-04-10 LAB — TROPONIN I

## 2017-04-10 IMAGING — CT CT CERVICAL SPINE W/O CM
5 of 8 series · 12 of 33 positions shown, 13 images · non-contrast
Comparison: C-spine MRI 09/10/2013

CLINICAL DATA: Syncope.  Neck pain.

EXAM:
CT HEAD WITHOUT CONTRAST
CT CERVICAL SPINE WITHOUT CONTRAST
TECHNIQUE: Multidetector CT imaging of the head and cervical spine was
performed following the standard protocol without intravenous
contrast. Multiplanar CT image reconstructions of the cervical spine
were also generated.

[Series 3: head 2.0 h70h · axial · 0.43mm/px · z∈[-119,-67]mm · 2 of 78 slices shown]
[im 26/78  bone]
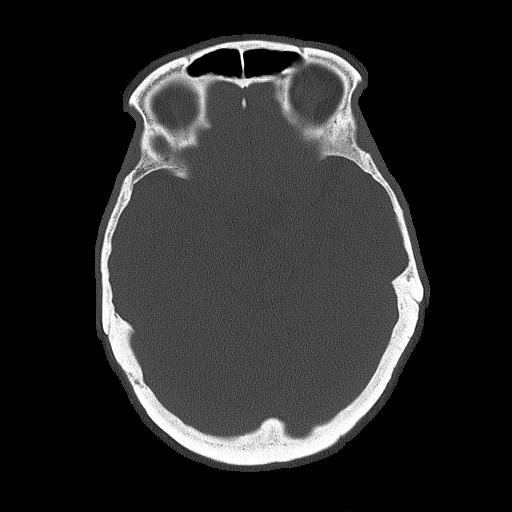
[im 52/78  bone]
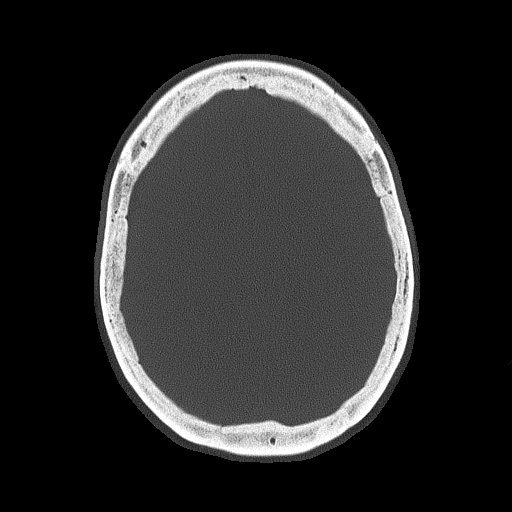

[Series 7: c_spine 2.0 i30s 3 · axial · 0.35mm/px · z∈[-257,-193]mm · 2 of 97 slices shown, 3 images]
[im 33/97  soft-tissue]
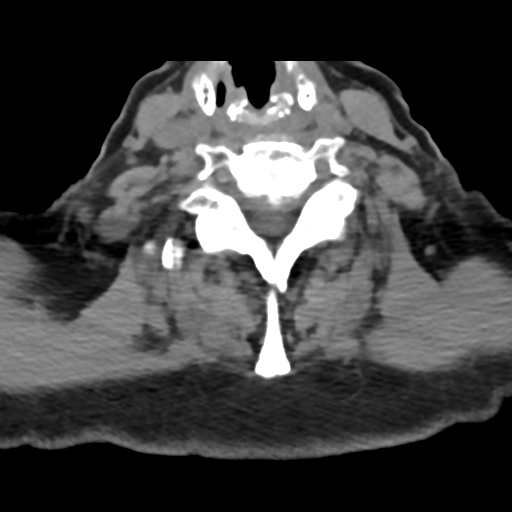
[im 33/97  bone]
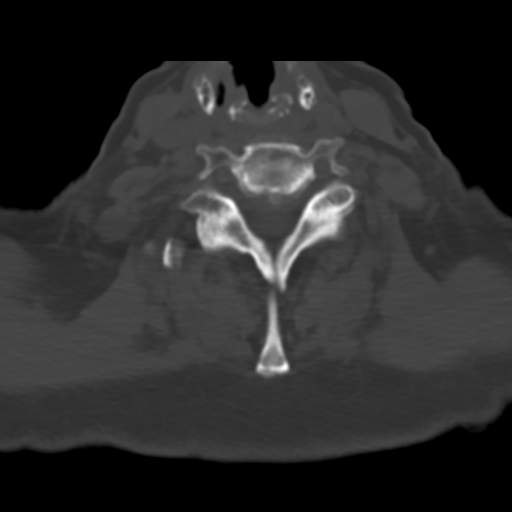
[im 65/97  bone]
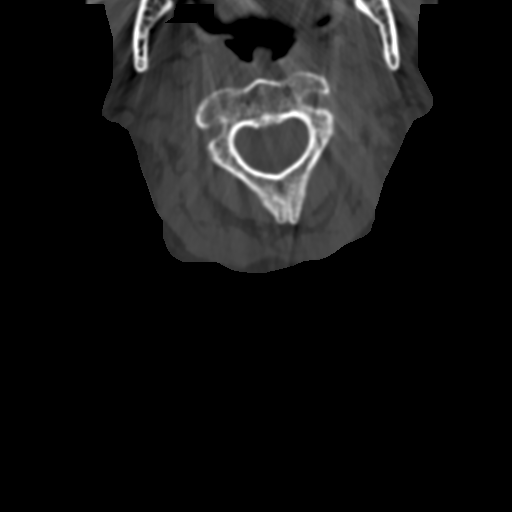

[Series 9: coronals · coronal · 0.32mm/px · 1 of 63 slices shown]
[im 32/63  bone]
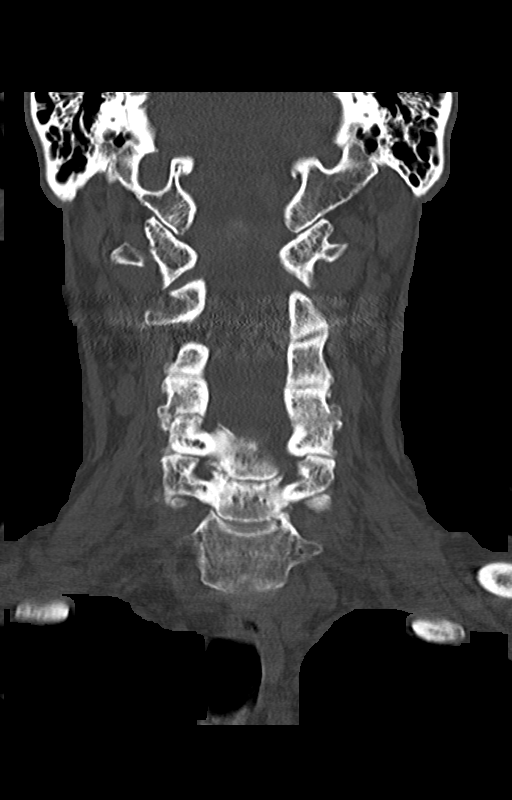

[Series 10: sagittals · sagittal · 0.33mm/px · 5 of 70 slices shown]
[im 12/70  bone]
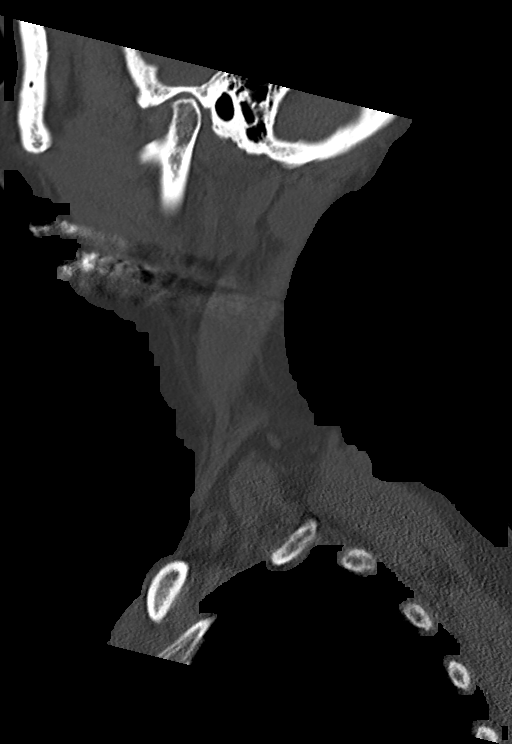
[im 24/70  bone]
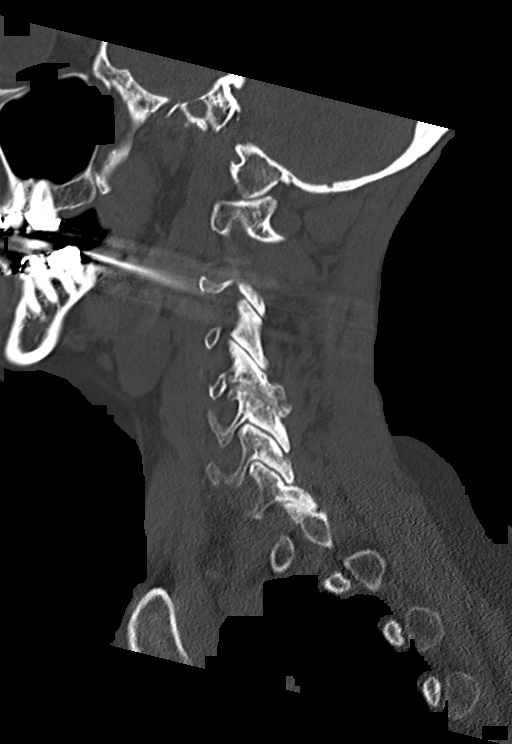
[im 35/70  bone]
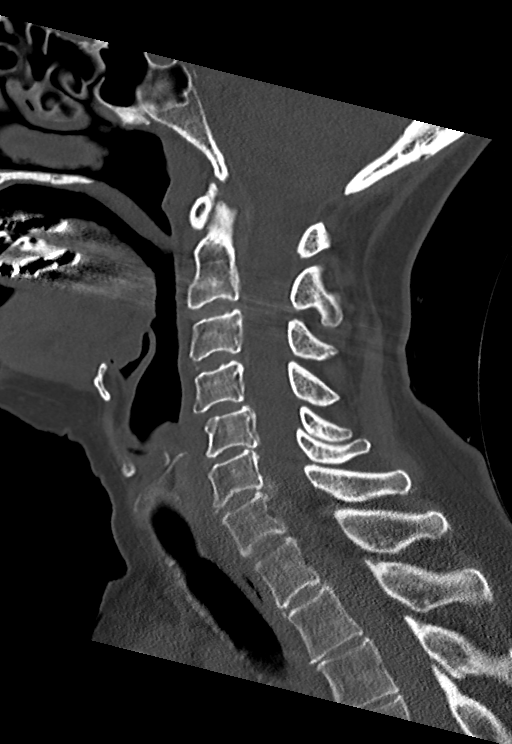
[im 47/70  bone]
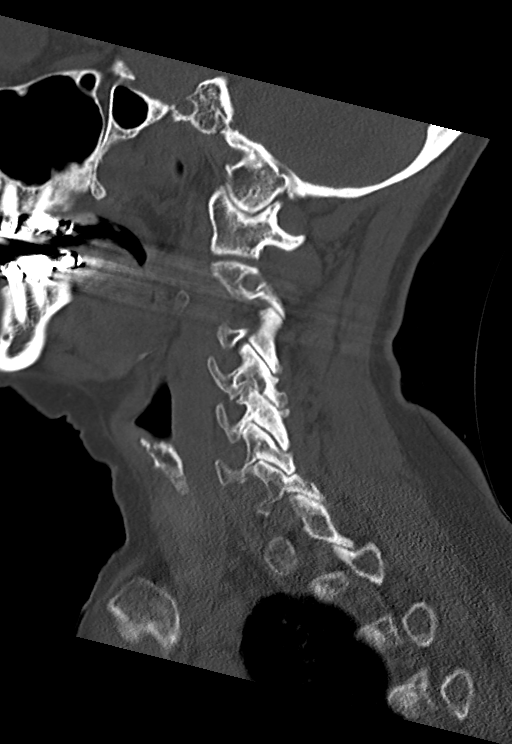
[im 58/70  bone]
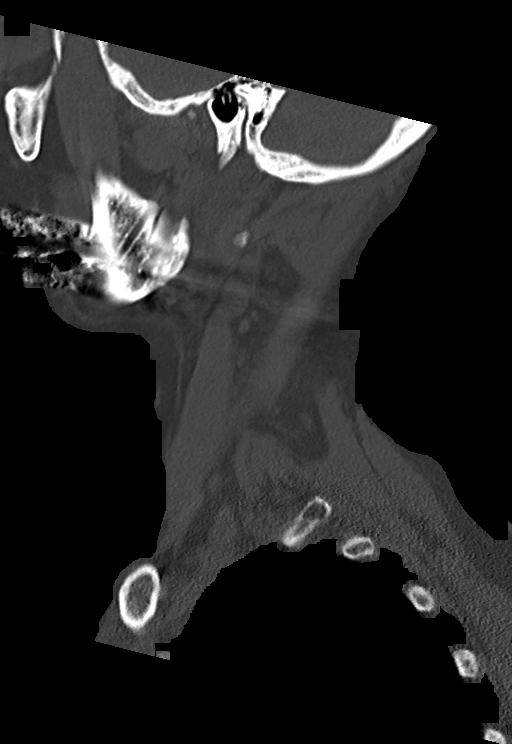

[Series 11: orthogonals · axial · 0.28mm/px · z∈[-276,-214]mm · 2 of 98 slices shown]
[im 33/98  bone]
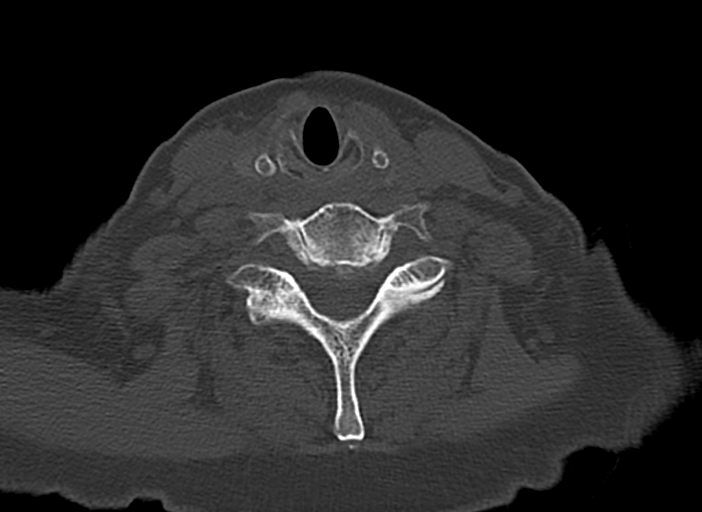
[im 65/98  bone]
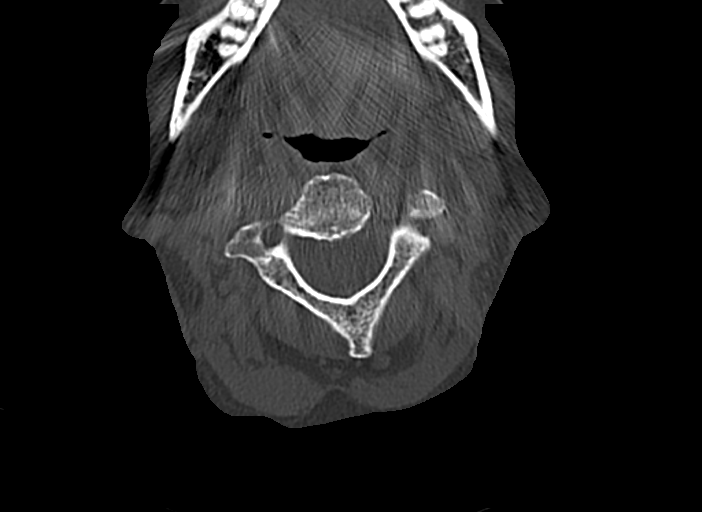

[12 of 33 positions shown; findings below may reference images not displayed]

FINDINGS: CT HEAD FINDINGS

Brain: No acute intracranial abnormality. Specifically, no
hemorrhage, hydrocephalus, mass lesion, acute infarction, or
significant intracranial injury.

Vascular: No hyperdense vessel or unexpected calcification.

Skull: No acute calvarial abnormality.

Sinuses/Orbits: Visualized paranasal sinuses and mastoids clear.
Orbital soft tissues unremarkable.

Other: None

CT CERVICAL SPINE FINDINGS

Alignment: Slight anterolisthesis of C4 on C5 related to facet
disease. Slight retrolisthesis of C5 on C6, again related to facet
disease.

Skull base and vertebrae: No fracture.

Soft tissues and spinal canal: Prevertebral soft tissues are normal.
No epidural or paraspinal hematoma.

Disc levels: Degenerative disc disease changes at C4-5 thru C6-7
with disc space narrowing. Diffuse bilateral degenerative facet
disease.

Upper chest: Negative

Other: None
IMPRESSION: No intracranial abnormality.

No acute bony abnormality in the cervical spine. Degenerative disc
and facet disease.

## 2017-04-10 MED ORDER — SODIUM CHLORIDE 0.9 % IV BOLUS (SEPSIS)
1000.0000 mL | Freq: Once | INTRAVENOUS | Status: AC
  Administered 2017-04-10: 1000 mL via INTRAVENOUS

## 2017-04-10 MED ORDER — SODIUM CHLORIDE 0.9 % IV BOLUS (SEPSIS)
1000.0000 mL | Freq: Once | INTRAVENOUS | Status: AC
Start: 2017-04-10 — End: 2017-04-10
  Administered 2017-04-10: 1000 mL via INTRAVENOUS

## 2017-04-10 MED ORDER — DIPHENHYDRAMINE HCL 50 MG/ML IJ SOLN
25.0000 mg | Freq: Once | INTRAMUSCULAR | Status: AC
  Administered 2017-04-10: 25 mg via INTRAVENOUS
  Filled 2017-04-10: qty 1

## 2017-04-10 MED ORDER — METHYLPREDNISOLONE SODIUM SUCC 125 MG IJ SOLR
80.0000 mg | Freq: Once | INTRAMUSCULAR | Status: AC
  Administered 2017-04-10: 80 mg via INTRAVENOUS
  Filled 2017-04-10: qty 2

## 2017-04-10 MED ORDER — PREDNISONE 20 MG PO TABS: ORAL_TABLET | ORAL | 0 refills | Status: DC

## 2017-04-10 MED ORDER — FAMOTIDINE IN NACL 20-0.9 MG/50ML-% IV SOLN
20.0000 mg | Freq: Once | INTRAVENOUS | Status: AC
  Administered 2017-04-10: 20 mg via INTRAVENOUS
  Filled 2017-04-10: qty 50

## 2017-04-10 NOTE — ED Notes (Signed)
Pt on cardiac monitor and auto VS 

## 2017-04-10 NOTE — ED Provider Notes (Signed)
[place] DEPT [facility] Provider Note   CSN: [id number] Arrival date & time: 04/10/17  2202     History   Chief Complaint Chief Complaint  Patient presents with  . Loss of Consciousness    HPI Veronica Mcclain is a 66 y.o. female.  Patient c/o syncopal episode while on toilet this AM.  Patient indicates she felt normal until yesterday when developed rash to trunk and extremities, states blotchy, faintly red, itchy, took benadryl.  When got up this AM, felt as thought bilateral hands/arms werent working/moving.  No numbness/tingling. Those symptoms improved over several minutes.  Patient then indicates went to bathroom, had to urinate, and when on toilet felt faint, lightheaded, flush, dizzy, w brief loc.  Was supported by family, no trauma/fall/injury.  Patient denies any recent medication changes or new meds. No change in foods/diet. No change in any home or personal products. Denies headache. No change in speech or vision. No unilateral numbness or weakness. Denies chest pain or discomfort. No sob. No vomiting or diarrhea. No blood loss or melena. No dysuria or gu c/o.    The history is provided by the patient.  Loss of Consciousness   Associated symptoms include weakness. Pertinent negatives include abdominal pain, back pain, chest pain, confusion, fever, headaches, palpitations and vomiting.    Past Medical History:  Diagnosis Date  . Cancer ([place])   . GERD (gastroesophageal reflux disease)   . Hyperlipidemia     Patient Active Problem List   Diagnosis Date Noted  . 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 Abdominal pain 01/21/2012  . Benign hypertensive heart disease without heart failure 04/23/2011  . Flank pain 12/04/2010  . Hyperlipidemia   . GERD (gastroesophageal reflux disease)     Past Surgical History:  Procedure Laterality Date  . ABDOMINAL HYSTERECTOMY    . APPENDECTOMY    . BACK SURGERY    . BREAST SURGERY    . CESAREAN SECTION    . FOOT SURGERY      OB  History    No data available       Home Medications    Prior to Admission medications   Medication Sig Start Date End Date Taking? Authorizing Provider  atorvastatin (LIPITOR) 80 MG tablet TAKE 1 TABLET BY MOUTH EVERY DAY 10/24/14   Darlin Coco, MD  Cholecalciferol (VITAMIN D) 1000 UNITS capsule Take 1,000 Units by mouth daily. OCCASIONALLY     [provider]  cyclobenzaprine (FLEXERIL) 5 MG tablet TAKE 1 TABLET 3 TIMES A DAY AS NEEDED FOR MUSCLE SPASMS 08/12/14   Darlin Coco, MD  escitalopram (LEXAPRO) 10 MG tablet Take 20 mg by mouth daily.  07/18/15   [provider]  gabapentin (NEURONTIN) 600 MG tablet  10/16/16   [provider]  HYDROcodone-acetaminophen (NORCO) 10-325 MG tablet TAKE 1 TO 2 TABLETS BY MOUTH EVERY DAY AS NEEDED FOR PAIN 06/27/15   [provider]  KLOR-CON M20 20 MEQ tablet TAKE 1 TABLET BY MOUTH EVERY DAY 07/10/15   Darlin Coco, MD  pantoprazole (PROTONIX) 40 MG tablet TAKE 1 TABLET BY MOUTH EVERY DAY 10/17/15   Darlin Coco, MD  promethazine (PHENERGAN) 25 MG tablet Take 1 tablet (25 mg total) by mouth every 6 (six) hours as needed for nausea. Patient not taking: Reported on 10/21/2016 09/09/15   Darlin Coco, MD    Family History Family History  Problem Relation Age of Onset  . Coronary artery disease Father   . Coronary artery disease Mother   .  Pancreatic cancer Mother     Social History Social History  Substance Use Topics  . Smoking status: Former Smoker    Packs/day: 0.75    Years: 10.00    Types: Cigarettes  . Smokeless tobacco: Never Used  . Alcohol use No     Allergies   Latex and Sulfa antibiotics   Review of Systems Review of Systems  Constitutional: Negative for chills and fever.  HENT: Negative for sore throat.   Eyes: Negative for visual disturbance.  Respiratory: Negative for cough and shortness of breath.   Cardiovascular: Positive for syncope. Negative for chest pain,  palpitations and leg swelling.  Gastrointestinal: Negative for abdominal pain, blood in stool, diarrhea and vomiting.  Endocrine: Negative for polyuria.  Genitourinary: Negative for dysuria and flank pain.  Musculoskeletal: Negative for back pain.       States has disc problems in neck/neck pain.   Skin: Negative for rash.  Neurological: Positive for weakness. Negative for numbness and headaches.  Hematological: Does not bruise/bleed easily.  Psychiatric/Behavioral: Negative for confusion.     Physical Exam Updated Vital Signs BP (!) 111/53 (BP Location: Right Arm)   Pulse 92   Temp 98.4 F (36.9 C) (Oral)   Resp 16   SpO2 94%   Physical Exam  Constitutional: She is oriented to person, place, and time. She appears well-developed and well-nourished. No distress.  HENT:  Head: Atraumatic.  Mouth/Throat: Oropharynx is clear and moist.  Eyes: Pupils are equal, round, and reactive to light. Conjunctivae and EOM are normal. No scleral icterus.  Neck: Normal range of motion. Neck supple. No tracheal deviation present. No thyromegaly present.  No bruits. No stiffness or rigidity.   Cardiovascular: Normal rate, regular rhythm, normal heart sounds and intact distal pulses.  Exam reveals no gallop and no friction rub.   No murmur heard. Pulmonary/Chest: Effort normal and breath sounds normal. No respiratory distress.  Abdominal: Soft. Normal appearance and bowel sounds are normal. She exhibits no distension and no mass. There is no tenderness. There is no rebound and no guarding. No hernia.  No puls mass.   Genitourinary:  Genitourinary Comments: No cva tenderness  Musculoskeletal: She exhibits no edema or tenderness.  CTLS spine, non tender, aligned, no step off.   Lymphadenopathy:    She has no cervical adenopathy.  Neurological: She is alert and oriented to person, place, and time. No cranial nerve deficit.  Speech clear/fluent. No pronator drift. Motor intact bil, stre 5/5. sens  grossly intact.   Skin: Skin is warm and dry. No rash noted. She is not diaphoretic.  Pt with sparse, faintly erythematous, blotchy dermatitis to trunk and bil ext. No palms or soles. No mucous membrane lesions.   Psychiatric: She has a normal mood and affect.  Nursing note and vitals reviewed.    ED Treatments / Results  Labs (all labs ordered are listed, but only abnormal results are displayed) Results for orders placed or performed during the hospital encounter of 37/62/83  Basic metabolic panel  Result Value Ref Range   Sodium 136 135 - 145 mmol/L   Potassium 3.6 3.5 - 5.1 mmol/L   Chloride 100 (L) 101 - 111 mmol/L   CO2 25 22 - 32 mmol/L   Glucose, Bld 148 (H) 65 - 99 mg/dL   BUN 10 6 - 20 mg/dL   Creatinine, Ser 0.83 0.44 - 1.00 mg/dL   Calcium 9.1 8.9 - 10.3 mg/dL   GFR calc non Af Amer >60 >60  mL/min   GFR calc Af Amer >60 >60 mL/min   Anion gap 11 5 - 15  CBC  Result Value Ref Range   WBC 8.9 4.0 - 10.5 K/uL   RBC 4.33 3.87 - 5.11 MIL/uL   Hemoglobin 11.8 (L) 12.0 - 15.0 g/dL   HCT 36.4 36.0 - 46.0 %   MCV 84.1 78.0 - 100.0 fL   MCH 27.3 26.0 - 34.0 pg   MCHC 32.4 30.0 - 36.0 g/dL   RDW 17.9 (H) 11.5 - 15.5 %   Platelets 243 150 - 400 K/uL  Urinalysis, Routine w reflex microscopic  Result Value Ref Range   Color, Urine YELLOW YELLOW   APPearance CLEAR CLEAR   Specific Gravity, Urine 1.015 1.005 - 1.030   pH 5.5 5.0 - 8.0   Glucose, UA NEGATIVE NEGATIVE mg/dL   Hgb urine dipstick NEGATIVE NEGATIVE   Bilirubin Urine NEGATIVE NEGATIVE   Ketones, ur NEGATIVE NEGATIVE mg/dL   Protein, ur NEGATIVE NEGATIVE mg/dL   Nitrite NEGATIVE NEGATIVE   Leukocytes, UA TRACE (A) NEGATIVE  Troponin I  Result Value Ref Range   Troponin I <0.03 <0.03 ng/mL  Hepatic function panel  Result Value Ref Range   Total Protein 6.9 6.5 - 8.1 g/dL   Albumin 3.5 3.5 - 5.0 g/dL   AST 30 15 - 41 U/L   ALT 18 14 - 54 U/L   Alkaline Phosphatase 95 38 - 126 U/L   Total Bilirubin 0.7 0.3  - 1.2 mg/dL   Bilirubin, Direct 0.1 0.1 - 0.5 mg/dL   Indirect Bilirubin 0.6 0.3 - 0.9 mg/dL  Urinalysis, Microscopic (reflex)  Result Value Ref Range   RBC / HPF 0-5 0 - 5 RBC/hpf   WBC, UA 0-5 0 - 5 WBC/hpf   Bacteria, UA FEW (A) NONE SEEN   Squamous Epithelial / LPF 0-5 (A) NONE SEEN  CBG monitoring, ED  Result Value Ref Range   Glucose-Capillary 146 (H) 65 - 99 mg/dL   Comment 1 Notify RN     EKG  EKG Interpretation  Date/Time:  Sunday April 10 2017 08:23:16 EDT Ventricular Rate:  89 PR Interval:    QRS Duration: 91 QT Interval:  351 QTC Calculation: 427 R Axis:   60 Text Interpretation:  Sinus rhythm Non-specific ST-t changes Confirmed by Lajean Saver [phone]) on 04/10/2017 8:32:28 AM       Radiology Ct Head Wo Contrast  Result Date: 04/10/2017 CLINICAL DATA:  Syncope.  Neck pain. EXAM: CT HEAD WITHOUT CONTRAST CT CERVICAL SPINE WITHOUT CONTRAST TECHNIQUE: Multidetector CT imaging of the head and cervical spine was performed following the standard protocol without intravenous contrast. Multiplanar CT image reconstructions of the cervical spine were also generated. COMPARISON:  C-spine MRI 09/10/2013 FINDINGS: CT HEAD FINDINGS Brain: No acute intracranial abnormality. Specifically, no hemorrhage, hydrocephalus, mass lesion, acute infarction, or significant intracranial injury. Vascular: No hyperdense vessel or unexpected calcification. Skull: No acute calvarial abnormality. Sinuses/Orbits: Visualized paranasal sinuses and mastoids clear. Orbital soft tissues unremarkable. Other: None CT CERVICAL SPINE FINDINGS Alignment: Slight anterolisthesis of C4 on C5 related to facet disease. Slight retrolisthesis of C5 on C6, again related to facet disease. Skull base and vertebrae: No fracture. Soft tissues and spinal canal: Prevertebral soft tissues are normal. No epidural or paraspinal hematoma. Disc levels: Degenerative disc disease changes at C4-5 thru C6-7 with disc space narrowing.  Diffuse bilateral degenerative facet disease. Upper chest: Negative Other: None IMPRESSION: No intracranial abnormality. No acute bony abnormality in the  cervical spine. Degenerative disc and facet disease. Electronically Signed   By: Rolm Baptise M.D.   On: 04/10/2017 09:47   Ct Cervical Spine Wo Contrast  Result Date: 04/10/2017 CLINICAL DATA:  Syncope.  Neck pain. EXAM: CT HEAD WITHOUT CONTRAST CT CERVICAL SPINE WITHOUT CONTRAST TECHNIQUE: Multidetector CT imaging of the head and cervical spine was performed following the standard protocol without intravenous contrast. Multiplanar CT image reconstructions of the cervical spine were also generated. COMPARISON:  C-spine MRI 09/10/2013 FINDINGS: CT HEAD FINDINGS Brain: No acute intracranial abnormality. Specifically, no hemorrhage, hydrocephalus, mass lesion, acute infarction, or significant intracranial injury. Vascular: No hyperdense vessel or unexpected calcification. Skull: No acute calvarial abnormality. Sinuses/Orbits: Visualized paranasal sinuses and mastoids clear. Orbital soft tissues unremarkable. Other: None CT CERVICAL SPINE FINDINGS Alignment: Slight anterolisthesis of C4 on C5 related to facet disease. Slight retrolisthesis of C5 on C6, again related to facet disease. Skull base and vertebrae: No fracture. Soft tissues and spinal canal: Prevertebral soft tissues are normal. No epidural or paraspinal hematoma. Disc levels: Degenerative disc disease changes at C4-5 thru C6-7 with disc space narrowing. Diffuse bilateral degenerative facet disease. Upper chest: Negative Other: None IMPRESSION: No intracranial abnormality. No acute bony abnormality in the cervical spine. Degenerative disc and facet disease. Electronically Signed   By: Rolm Baptise M.D.   On: 04/10/2017 09:47    Procedures Procedures (including critical care time)  Medications Ordered in ED Medications  sodium chloride 0.9 % bolus 1,000 mL (1,000 mLs Intravenous New Bag/Given  04/10/17 0910)     Initial Impression / Assessment and Plan / ED Course  I have reviewed the triage vital signs and the nursing notes.  Pertinent labs & imaging results that were available during my care of the patient were reviewed by me and considered in my medical decision making (see chart for details).  Iv ns bolus. Labs. Imaging.  Reviewed nursing notes and prior charts for additional history.   Iv ns boluses.  Benadryl and pepcid for dermatitis - cause unclear.  Po fluids.snack.  Pt is up/ambulatory about ED.  No recurrent faintness/dizziness. Earlier episode at home on toilet. No chest pain or discomfort. No sob. No palpitations. No numbness/weakness  ?vagal episode.   Remains afebrile. No new c/o.   Pt requests steroid/dose pack for dermatitis. Will give rx and dose in ED.   Pt continues to feel improved, no recurrent dizziness/faintness. No numbness/weakness. No fever or chills.   Pt currently appears stable for d/c.  rec close pcp f/u. Return precautions provided.   Final Clinical Impressions(s) / ED Diagnoses   Final diagnoses:  None    New Prescriptions New Prescriptions   No medications on file     Lajean Saver, MD 04/10/17 1306

## 2017-04-10 NOTE — ED Triage Notes (Addendum)
Pt states she had a syncopal episode while she was using the bathroom this morning. Pt states she was urinating and became dizzy. Pt also reports she started having difficulty moving her hands during the night which concerned her. Pt denies chest pain, SOB. Pt states EMS was called but she did not get transported by them.

## 2017-04-10 NOTE — ED Notes (Signed)
AMBULATING: Pt ambulated unassisted, in NAD. Denys feeling dizzy or abnormal.   Pt given a snack and drink.

## 2017-04-10 NOTE — ED Notes (Signed)
Hives noted to be worsening to BUE and chest/neck

## 2017-04-10 NOTE — ED Notes (Signed)
Pt noted to have hives to arms, legs, and backs.

## 2017-04-10 NOTE — ED Notes (Signed)
Patient transported to CT 

## 2017-04-10 NOTE — Discharge Instructions (Signed)
It was our pleasure to provide your ER care today - we hope that you feel better.  Rest. Drink adequate fluids.   Take benadryl as need - no driving when taking. Take prednisone as prescribed.  Follow up with your doctor this Monday or Tuesday for recheck - call office tomorrow morning to arrange appointment.   Return to ER if worse, new symptoms, fevers, chest pain, trouble breathing, weak/fainting, or other concern.

## 2017-04-19 DIAGNOSIS — Z79899 Other long term (current) drug therapy: Secondary | ICD-10-CM | POA: Diagnosis not present

## 2017-04-19 DIAGNOSIS — Z483 Aftercare following surgery for neoplasm: Secondary | ICD-10-CM | POA: Diagnosis not present

## 2017-04-19 DIAGNOSIS — Z7982 Long term (current) use of aspirin: Secondary | ICD-10-CM | POA: Diagnosis not present

## 2017-04-19 DIAGNOSIS — I10 Essential (primary) hypertension: Secondary | ICD-10-CM | POA: Diagnosis not present

## 2017-04-19 DIAGNOSIS — Z08 Encounter for follow-up examination after completed treatment for malignant neoplasm: Secondary | ICD-10-CM | POA: Diagnosis not present

## 2017-04-19 DIAGNOSIS — Z882 Allergy status to sulfonamides status: Secondary | ICD-10-CM | POA: Diagnosis not present

## 2017-04-19 DIAGNOSIS — C4922 Malignant neoplasm of connective and soft tissue of left lower limb, including hip: Secondary | ICD-10-CM | POA: Diagnosis not present

## 2017-04-19 DIAGNOSIS — E7800 Pure hypercholesterolemia, unspecified: Secondary | ICD-10-CM | POA: Diagnosis not present

## 2017-04-19 DIAGNOSIS — Z87891 Personal history of nicotine dependence: Secondary | ICD-10-CM | POA: Diagnosis not present

## 2017-05-04 DIAGNOSIS — Z6824 Body mass index (BMI) 24.0-24.9, adult: Secondary | ICD-10-CM | POA: Diagnosis not present

## 2017-05-04 DIAGNOSIS — M545 Low back pain: Secondary | ICD-10-CM | POA: Diagnosis not present

## 2017-05-09 DIAGNOSIS — H02413 Mechanical ptosis of bilateral eyelids: Secondary | ICD-10-CM | POA: Diagnosis not present

## 2017-05-09 DIAGNOSIS — H02412 Mechanical ptosis of left eyelid: Secondary | ICD-10-CM | POA: Diagnosis not present

## 2017-05-09 DIAGNOSIS — H02411 Mechanical ptosis of right eyelid: Secondary | ICD-10-CM | POA: Diagnosis not present

## 2017-05-12 DIAGNOSIS — M545 Low back pain: Secondary | ICD-10-CM | POA: Diagnosis not present

## 2017-05-12 DIAGNOSIS — M542 Cervicalgia: Secondary | ICD-10-CM | POA: Diagnosis not present

## 2017-05-25 DIAGNOSIS — N951 Menopausal and female climacteric states: Secondary | ICD-10-CM | POA: Diagnosis not present

## 2017-06-23 ENCOUNTER — Emergency Department (HOSPITAL_COMMUNITY)
Admission: EM | Admit: 2017-06-23 | Discharge: 2017-06-24 | Disposition: A | Discharge status: Other Acute Inpatient Hospital | Payer: Medicare Other | Attending: Emergency Medicine | Admitting: Emergency Medicine

## 2017-06-23 DIAGNOSIS — W19XXXA Unspecified fall, initial encounter: Secondary | ICD-10-CM

## 2017-06-23 DIAGNOSIS — R52 Pain, unspecified: Secondary | ICD-10-CM | POA: Diagnosis not present

## 2017-06-23 DIAGNOSIS — Z87891 Personal history of nicotine dependence: Secondary | ICD-10-CM | POA: Insufficient documentation

## 2017-06-23 DIAGNOSIS — Y939 Activity, unspecified: Secondary | ICD-10-CM | POA: Diagnosis not present

## 2017-06-23 DIAGNOSIS — Y999 Unspecified external cause status: Secondary | ICD-10-CM | POA: Diagnosis not present

## 2017-06-23 DIAGNOSIS — Z79899 Other long term (current) drug therapy: Secondary | ICD-10-CM | POA: Diagnosis not present

## 2017-06-23 DIAGNOSIS — W010XXA Fall on same level from slipping, tripping and stumbling without subsequent striking against object, initial encounter: Secondary | ICD-10-CM | POA: Diagnosis not present

## 2017-06-23 DIAGNOSIS — Y92009 Unspecified place in unspecified non-institutional (private) residence as the place of occurrence of the external cause: Secondary | ICD-10-CM | POA: Diagnosis not present

## 2017-06-23 DIAGNOSIS — Z91040 Latex allergy status: Secondary | ICD-10-CM | POA: Diagnosis not present

## 2017-06-23 DIAGNOSIS — Z85830 Personal history of malignant neoplasm of bone: Secondary | ICD-10-CM | POA: Diagnosis not present

## 2017-06-23 DIAGNOSIS — M25562 Pain in left knee: Secondary | ICD-10-CM | POA: Diagnosis not present

## 2017-06-23 DIAGNOSIS — S72322A Displaced transverse fracture of shaft of left femur, initial encounter for closed fracture: Secondary | ICD-10-CM | POA: Insufficient documentation

## 2017-06-23 DIAGNOSIS — M79605 Pain in left leg: Secondary | ICD-10-CM | POA: Diagnosis present

## 2017-06-23 DIAGNOSIS — Z7982 Long term (current) use of aspirin: Secondary | ICD-10-CM | POA: Diagnosis not present

## 2017-06-23 NOTE — ED Notes (Signed)
Bed: GO77 Expected date:  Expected time:  Means of arrival:  Comments: EMS [place]/fall-Morphine for pain

## 2017-06-24 ENCOUNTER — Emergency Department (HOSPITAL_COMMUNITY): Payer: Medicare Other

## 2017-06-24 ENCOUNTER — Encounter (HOSPITAL_COMMUNITY): Payer: Self-pay

## 2017-06-24 DIAGNOSIS — T66XXXA Radiation sickness, unspecified, initial encounter: Secondary | ICD-10-CM | POA: Diagnosis present

## 2017-06-24 DIAGNOSIS — Y939 Activity, unspecified: Secondary | ICD-10-CM | POA: Diagnosis not present

## 2017-06-24 DIAGNOSIS — J019 Acute sinusitis, unspecified: Secondary | ICD-10-CM | POA: Diagnosis not present

## 2017-06-24 DIAGNOSIS — Z85830 Personal history of malignant neoplasm of bone: Secondary | ICD-10-CM | POA: Diagnosis not present

## 2017-06-24 DIAGNOSIS — Z91040 Latex allergy status: Secondary | ICD-10-CM | POA: Diagnosis not present

## 2017-06-24 DIAGNOSIS — Z85831 Personal history of malignant neoplasm of soft tissue: Secondary | ICD-10-CM | POA: Diagnosis not present

## 2017-06-24 DIAGNOSIS — Y999 Unspecified external cause status: Secondary | ICD-10-CM | POA: Diagnosis not present

## 2017-06-24 DIAGNOSIS — S72392A Other fracture of shaft of left femur, initial encounter for closed fracture: Secondary | ICD-10-CM | POA: Diagnosis not present

## 2017-06-24 DIAGNOSIS — S72332A Displaced oblique fracture of shaft of left femur, initial encounter for closed fracture: Secondary | ICD-10-CM | POA: Diagnosis not present

## 2017-06-24 DIAGNOSIS — M24159 Other articular cartilage disorders, unspecified hip: Secondary | ICD-10-CM | POA: Diagnosis not present

## 2017-06-24 DIAGNOSIS — F419 Anxiety disorder, unspecified: Secondary | ICD-10-CM | POA: Diagnosis present

## 2017-06-24 DIAGNOSIS — Z79899 Other long term (current) drug therapy: Secondary | ICD-10-CM | POA: Diagnosis not present

## 2017-06-24 DIAGNOSIS — S72302A Unspecified fracture of shaft of left femur, initial encounter for closed fracture: Secondary | ICD-10-CM | POA: Diagnosis not present

## 2017-06-24 DIAGNOSIS — M79662 Pain in left lower leg: Secondary | ICD-10-CM | POA: Diagnosis not present

## 2017-06-24 DIAGNOSIS — Z87891 Personal history of nicotine dependence: Secondary | ICD-10-CM | POA: Diagnosis not present

## 2017-06-24 DIAGNOSIS — I119 Hypertensive heart disease without heart failure: Secondary | ICD-10-CM | POA: Diagnosis present

## 2017-06-24 DIAGNOSIS — Y92009 Unspecified place in unspecified non-institutional (private) residence as the place of occurrence of the external cause: Secondary | ICD-10-CM | POA: Diagnosis not present

## 2017-06-24 DIAGNOSIS — M1612 Unilateral primary osteoarthritis, left hip: Secondary | ICD-10-CM | POA: Diagnosis not present

## 2017-06-24 DIAGNOSIS — J329 Chronic sinusitis, unspecified: Secondary | ICD-10-CM | POA: Diagnosis not present

## 2017-06-24 DIAGNOSIS — Z9221 Personal history of antineoplastic chemotherapy: Secondary | ICD-10-CM | POA: Diagnosis not present

## 2017-06-24 DIAGNOSIS — G8929 Other chronic pain: Secondary | ICD-10-CM | POA: Diagnosis not present

## 2017-06-24 DIAGNOSIS — M1990 Unspecified osteoarthritis, unspecified site: Secondary | ICD-10-CM | POA: Diagnosis not present

## 2017-06-24 DIAGNOSIS — Z880 Allergy status to penicillin: Secondary | ICD-10-CM | POA: Diagnosis not present

## 2017-06-24 DIAGNOSIS — E876 Hypokalemia: Secondary | ICD-10-CM | POA: Diagnosis not present

## 2017-06-24 DIAGNOSIS — Z7982 Long term (current) use of aspirin: Secondary | ICD-10-CM | POA: Diagnosis not present

## 2017-06-24 DIAGNOSIS — M47816 Spondylosis without myelopathy or radiculopathy, lumbar region: Secondary | ICD-10-CM | POA: Diagnosis not present

## 2017-06-24 DIAGNOSIS — R000 Tachycardia, unspecified: Secondary | ICD-10-CM | POA: Diagnosis not present

## 2017-06-24 DIAGNOSIS — M160 Bilateral primary osteoarthritis of hip: Secondary | ICD-10-CM | POA: Diagnosis not present

## 2017-06-24 DIAGNOSIS — M79605 Pain in left leg: Secondary | ICD-10-CM | POA: Diagnosis not present

## 2017-06-24 DIAGNOSIS — Z882 Allergy status to sulfonamides status: Secondary | ICD-10-CM | POA: Diagnosis not present

## 2017-06-24 DIAGNOSIS — S72322A Displaced transverse fracture of shaft of left femur, initial encounter for closed fracture: Secondary | ICD-10-CM | POA: Diagnosis not present

## 2017-06-24 DIAGNOSIS — E7800 Pure hypercholesterolemia, unspecified: Secondary | ICD-10-CM | POA: Diagnosis not present

## 2017-06-24 DIAGNOSIS — E785 Hyperlipidemia, unspecified: Secondary | ICD-10-CM | POA: Diagnosis present

## 2017-06-24 DIAGNOSIS — D649 Anemia, unspecified: Secondary | ICD-10-CM | POA: Diagnosis not present

## 2017-06-24 DIAGNOSIS — K5900 Constipation, unspecified: Secondary | ICD-10-CM | POA: Diagnosis not present

## 2017-06-24 DIAGNOSIS — M8580 Other specified disorders of bone density and structure, unspecified site: Secondary | ICD-10-CM | POA: Diagnosis not present

## 2017-06-24 DIAGNOSIS — M84452A Pathological fracture, left femur, initial encounter for fracture: Secondary | ICD-10-CM | POA: Diagnosis present

## 2017-06-24 DIAGNOSIS — M542 Cervicalgia: Secondary | ICD-10-CM | POA: Diagnosis not present

## 2017-06-24 DIAGNOSIS — M1712 Unilateral primary osteoarthritis, left knee: Secondary | ICD-10-CM | POA: Diagnosis not present

## 2017-06-24 DIAGNOSIS — D62 Acute posthemorrhagic anemia: Secondary | ICD-10-CM | POA: Diagnosis not present

## 2017-06-24 DIAGNOSIS — M533 Sacrococcygeal disorders, not elsewhere classified: Secondary | ICD-10-CM | POA: Diagnosis not present

## 2017-06-24 DIAGNOSIS — F329 Major depressive disorder, single episode, unspecified: Secondary | ICD-10-CM | POA: Diagnosis present

## 2017-06-24 DIAGNOSIS — W010XXA Fall on same level from slipping, tripping and stumbling without subsequent striking against object, initial encounter: Secondary | ICD-10-CM | POA: Diagnosis not present

## 2017-06-24 DIAGNOSIS — K219 Gastro-esophageal reflux disease without esophagitis: Secondary | ICD-10-CM | POA: Diagnosis present

## 2017-06-24 LAB — CBC WITH DIFFERENTIAL/PLATELET
BASOS PCT: 0 %
Basophils Absolute: 0 10*3/uL (ref 0.0–0.1)
EOS ABS: 0.1 10*3/uL (ref 0.0–0.7)
Eosinophils Relative: 1 %
HCT: 40.5 % (ref 36.0–46.0)
Hemoglobin: 13.3 g/dL (ref 12.0–15.0)
LYMPHS ABS: 1.4 10*3/uL (ref 0.7–4.0)
LYMPHS PCT: 15 %
MCH: 28.4 pg (ref 26.0–34.0)
MCHC: 32.8 g/dL (ref 30.0–36.0)
MCV: 86.5 fL (ref 78.0–100.0)
MONOS PCT: 9 %
Monocytes Absolute: 0.9 10*3/uL (ref 0.1–1.0)
NEUTROS ABS: 7.3 10*3/uL (ref 1.7–7.7)
NEUTROS PCT: 75 %
Platelets: 245 10*3/uL (ref 150–400)
RBC: 4.68 MIL/uL (ref 3.87–5.11)
RDW: 16.5 % — AB (ref 11.5–15.5)
WBC: 9.7 10*3/uL (ref 4.0–10.5)

## 2017-06-24 LAB — BASIC METABOLIC PANEL
ANION GAP: 10 (ref 5–15)
BUN: 14 mg/dL (ref 6–20)
CHLORIDE: 103 mmol/L (ref 101–111)
CO2: 27 mmol/L (ref 22–32)
Calcium: 9.3 mg/dL (ref 8.9–10.3)
Creatinine, Ser: 0.7 mg/dL (ref 0.44–1.00)
GFR calc non Af Amer: >60 mL/min (ref >60)
Glucose, Bld: 114 mg/dL — ABNORMAL HIGH (ref 65–99)
POTASSIUM: 3.6 mmol/L (ref 3.5–5.1)
Sodium: 140 mmol/L (ref 135–145)

## 2017-06-24 IMAGING — CR DG FEMUR 2+V*L*
7 series · 7 of 7 positions shown · non-contrast
Comparison: None.

CLINICAL DATA: Sudden pain and deformity upon standing up this
evening.

EXAM:
LEFT FEMUR 2 VIEWS

[x hip lat left (1 of 2)]
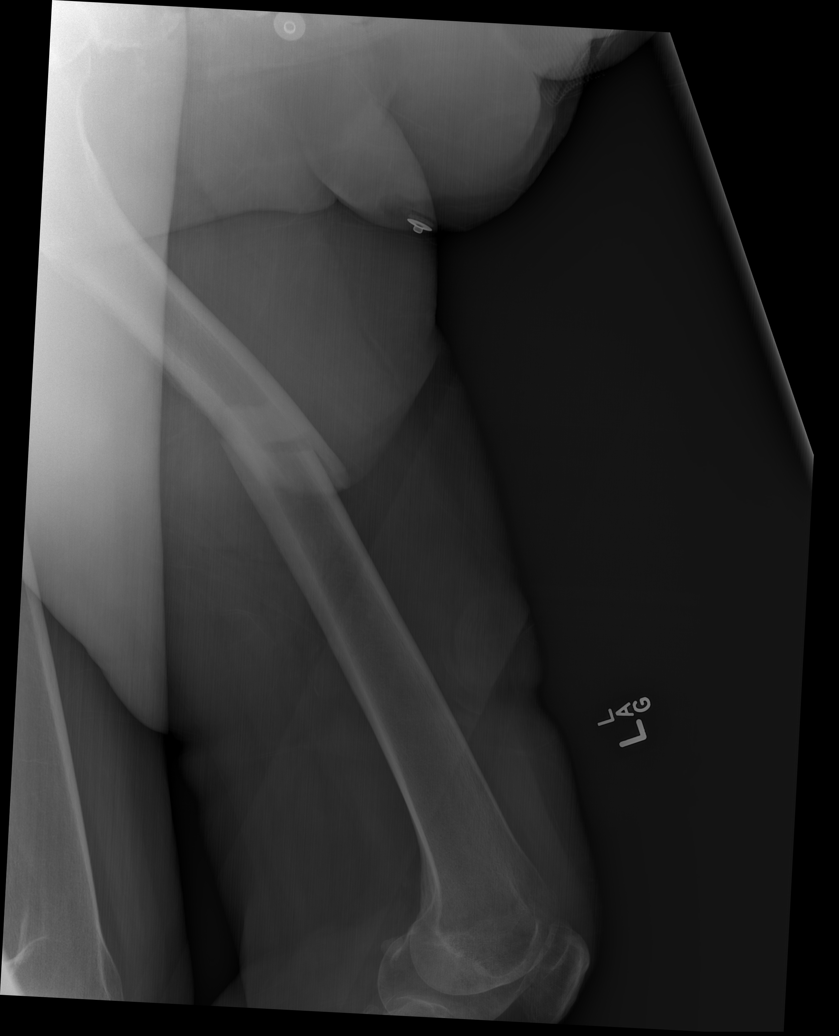

[x hip lat left (2 of 2)]
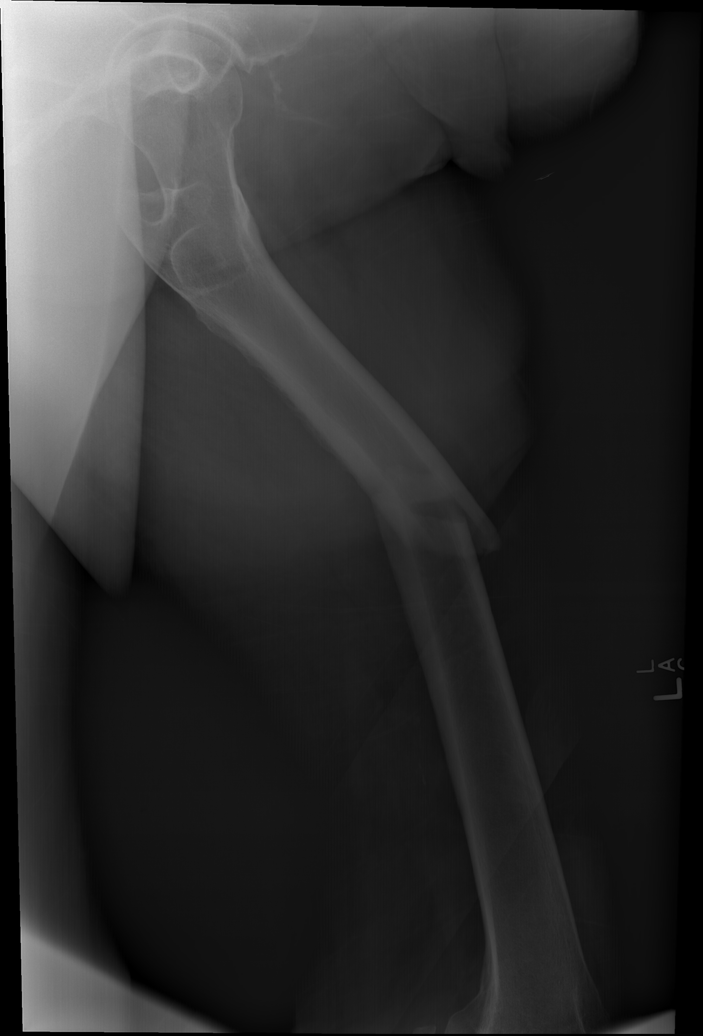

[x femur distal lat left]
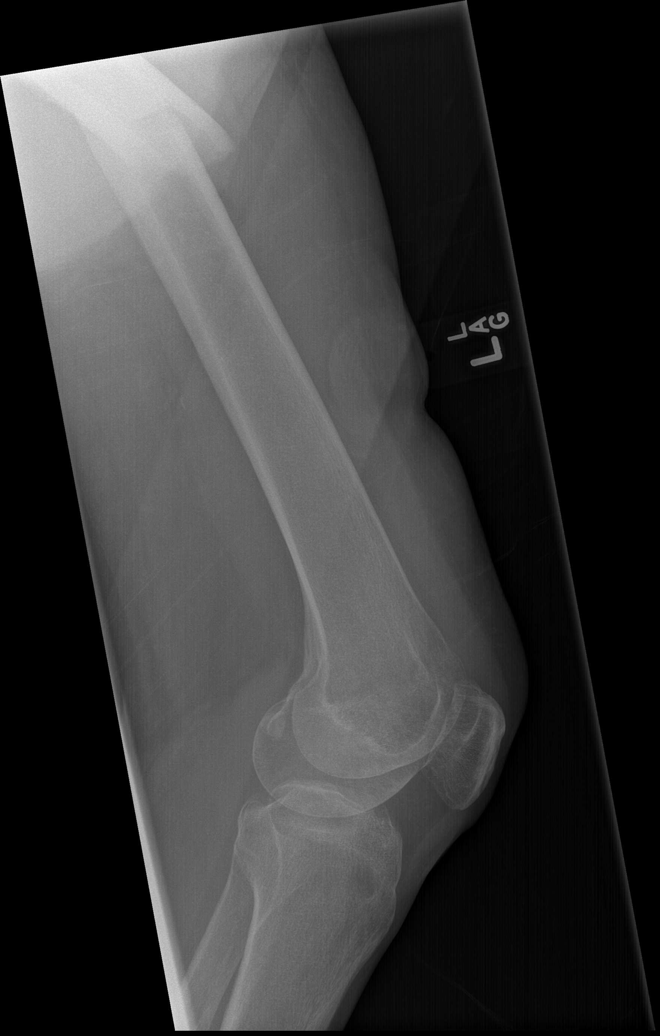

[x femur proximal ap left (1 of 2)]
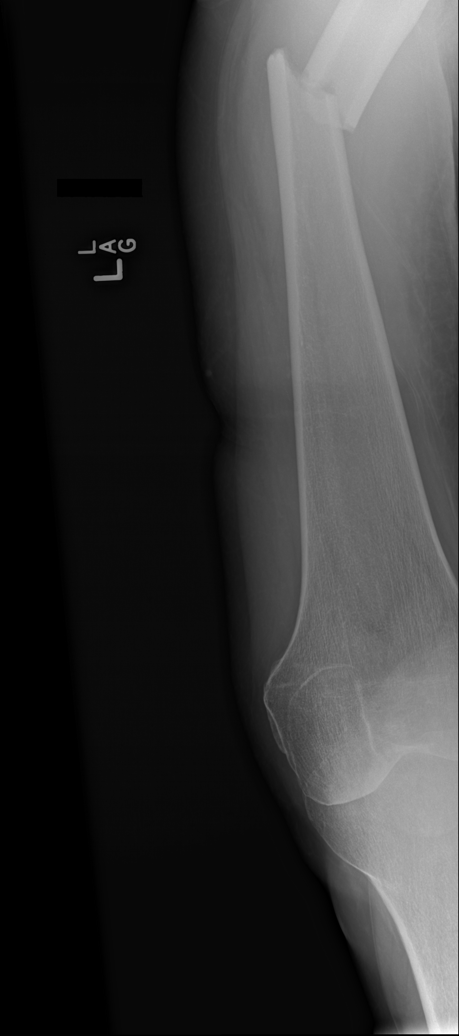

[x femur proximal ap left (2 of 2)]
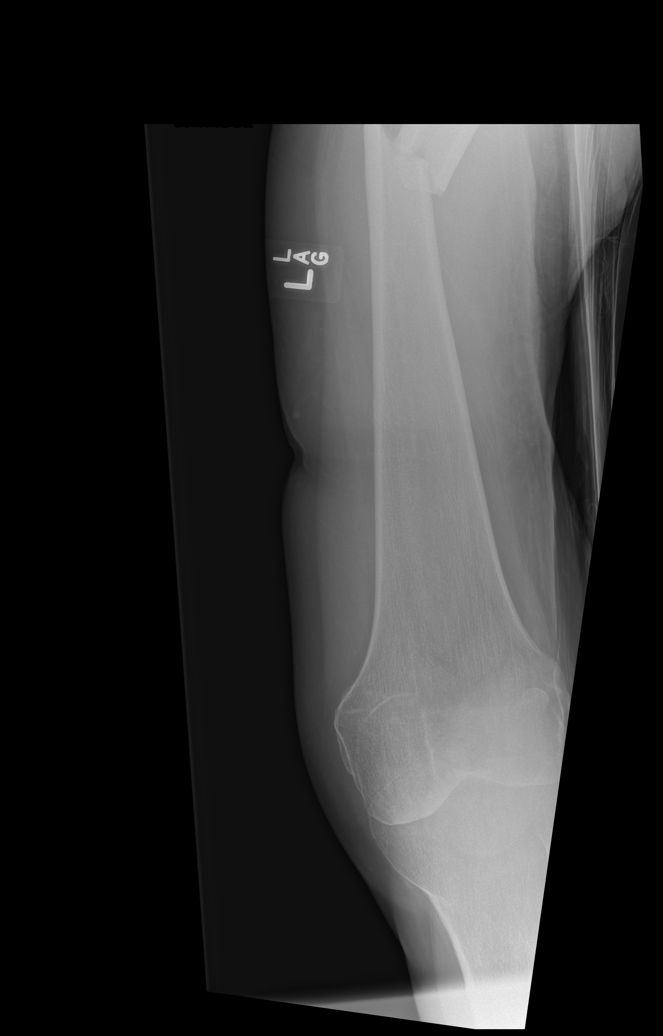

[w femur distal lat left (1 of 2)]
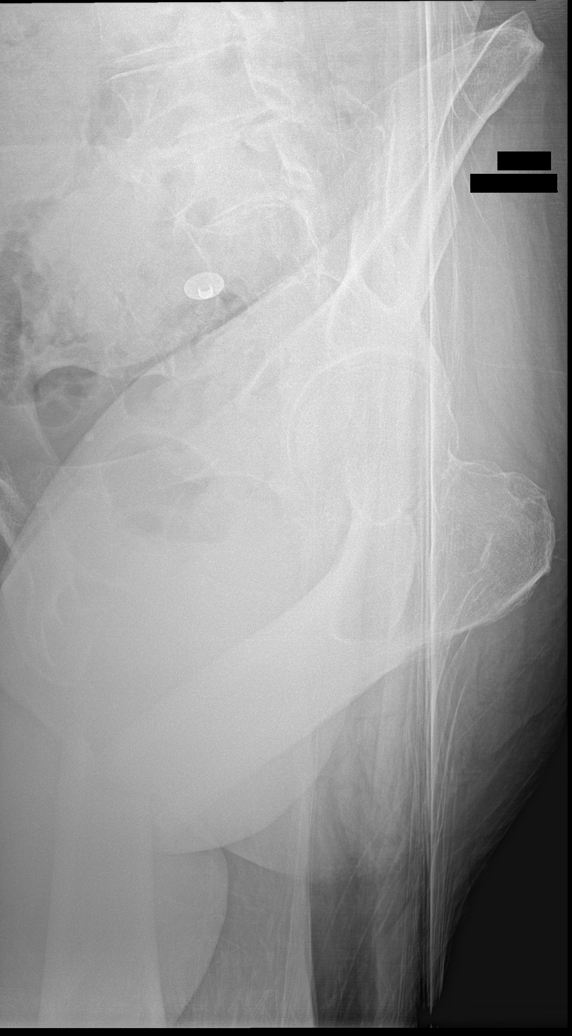

[w femur distal lat left (2 of 2)]
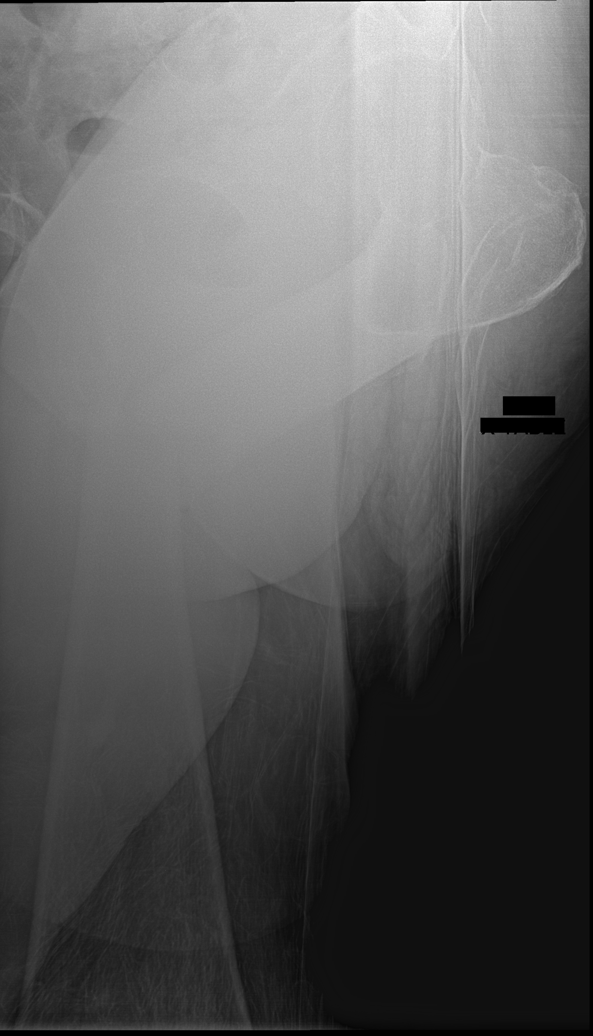

[7 of 7 positions shown; findings below may reference images not displayed]

FINDINGS: There is a midshaft diaphyseal fracture of the left femur with
moderate angulation.

No overt lytic or destructive lesion to confirm a pathologic basis
for the fracture, but the history is concerning.
IMPRESSION: Midshaft left femoral fracture.

## 2017-06-24 MED ORDER — HYDROMORPHONE HCL 1 MG/ML IJ SOLN
1.0000 mg | Freq: Once | INTRAMUSCULAR | Status: AC
  Administered 2017-06-24: 1 mg via INTRAVENOUS
  Filled 2017-06-24: qty 1

## 2017-06-24 MED ORDER — HYDROMORPHONE HCL 1 MG/ML IJ SOLN
1.0000 mg | Freq: Once | INTRAMUSCULAR | Status: AC
  Administered 2017-06-24: 1 mg via INTRAVENOUS

## 2017-06-24 MED ORDER — HYDROMORPHONE HCL 1 MG/ML IJ SOLN
INTRAMUSCULAR | Status: AC
  Filled 2017-06-24: qty 1

## 2017-06-24 NOTE — ED Notes (Signed)
Transported to [facility] in stable condition via [place]

## 2017-06-24 NOTE — ED Notes (Signed)
[facility] called and in route

## 2017-06-24 NOTE — ED Notes (Signed)
Husband is at bedside.

## 2017-06-24 NOTE — ED Notes (Signed)
[facility] called for update-states time unknown for transport-states "no truck available at this time".

## 2017-06-24 NOTE — ED Notes (Signed)
Patient complaining severe pain left leg-Dr. Ralene Bathe made awareand patient medicated as ordered

## 2017-06-24 NOTE — ED Provider Notes (Signed)
[place] DEPT Provider Note   CSN: [id number] Arrival date & time: 06/23/17  2355     History   Chief Complaint Chief Complaint  Patient presents with  . Knee Pain    L    HPI Veronica Mcclain is a 66 y.o. female.  The history is provided by the patient. No language interpreter was used.  Knee Pain      Veronica Mcclain is a 66 y.o. female who presents to the Emergency Department complaining of knee/leg pain.  She was getting up to stand from a seated position when she felt a loud pop in her left leg. She then fell to the ground. She did not sustain any injuries in the fall. She reports severe pain to the left leg and knee. She does have a history of sarcoma resection from the left thigh in April of this year at [facility] as well as radiation treatments. No head injury, chest pain, shortness of breath, loss of consciousness.  Past Medical History:  Diagnosis Date  . Cancer ([place])   . GERD (gastroesophageal reflux disease)   . Hyperlipidemia     Patient Active Problem List   Diagnosis Date Noted  . 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 Abdominal pain 01/21/2012  . Benign hypertensive heart disease without heart failure 04/23/2011  . Flank pain 12/04/2010  . Hyperlipidemia   . GERD (gastroesophageal reflux disease)     Past Surgical History:  Procedure Laterality Date  . ABDOMINAL HYSTERECTOMY    . APPENDECTOMY    . BACK SURGERY    . BREAST SURGERY    . CESAREAN SECTION    . FOOT SURGERY      OB History    No data available       Home Medications    Prior to Admission medications   Medication Sig Start Date End Date Taking? Authorizing Provider  aspirin EC 325 MG tablet Take 325 mg by mouth every other day. 01/03/17  Yes [provider]  atorvastatin (LIPITOR) 80 MG tablet TAKE 1 TABLET BY MOUTH EVERY DAY 10/24/14  Yes Darlin Coco, MD  B Complex Vitamins (VITAMIN-B COMPLEX) TABS Take 2 tablets by mouth every morning.   Yes  [provider]  Cholecalciferol (VITAMIN D) 1000 UNITS capsule Take 1,000 Units by mouth daily. OCCASIONALLY    Yes [provider]  cyclobenzaprine (FLEXERIL) 5 MG tablet TAKE 1 TABLET 3 TIMES A DAY AS NEEDED FOR MUSCLE SPASMS 08/12/14  Yes Darlin Coco, MD  gabapentin (NEURONTIN) 300 MG capsule Take 600 mg by mouth 3 (three) times daily.  10/16/16  Yes [provider]  HYDROcodone-acetaminophen (NORCO) 10-325 MG tablet TAKE 1 TO 2 TABLETS BY MOUTH EVERY DAY AS NEEDED FOR PAIN 06/27/15  Yes [provider]  KLOR-CON M20 20 MEQ tablet TAKE 1 TABLET BY MOUTH EVERY DAY 07/10/15  Yes Darlin Coco, MD  ondansetron (ZOFRAN-ODT) 8 MG disintegrating tablet Take 8 mg by mouth every 8 (eight) hours as needed. 07/19/16  Yes [provider]  pantoprazole (PROTONIX) 40 MG tablet TAKE 1 TABLET BY MOUTH EVERY DAY 10/17/15  Yes Darlin Coco, MD  venlafaxine (EFFEXOR) 37.5 MG tablet Take 1 tablet by mouth See admin instructions.  Take 1 tab orally daily in AM. Increase to 1 tab twice daily in two weeks if still having hot flushes   Yes [provider]  predniSONE (DELTASONE) 20 MG tablet 3 po once a day for 1 days, then 2 po  once a day for 2 days, then 1 po once a day for 2 days.  Start Monday 7/30. Patient not taking: Reported on 06/24/2017 04/10/17   Lajean Saver, MD  promethazine (PHENERGAN) 25 MG tablet Take 1 tablet (25 mg total) by mouth every 6 (six) hours as needed for nausea. Patient not taking: Reported on 10/21/2016 09/09/15   Darlin Coco, MD    Family History Family History  Problem Relation Age of Onset  . Coronary artery disease Father   . Coronary artery disease Mother   . Pancreatic cancer Mother     Social History Social History  Substance Use Topics  . Smoking status: Former Smoker    Packs/day: 0.75    Years: 10.00    Types: Cigarettes  . Smokeless tobacco: Never Used  . Alcohol use No     Allergies   Sulfa  antibiotics and Latex   Review of Systems Review of Systems  All other systems reviewed and are negative.    Physical Exam Updated Vital Signs BP (!) 153/69 (BP Location: Right Arm)   Pulse 95   Temp 98 F (36.7 C) (Oral)   Resp 15   Ht 5\' 8"  (1.727 m)   Wt 70.3 kg (155 lb)   SpO2 98%   BMI 23.57 kg/m   Physical Exam  Constitutional: She is oriented to person, place, and time. She appears well-developed and well-nourished.  Uncomfortable appearing  HENT:  Head: Normocephalic and atraumatic.  Cardiovascular: Normal rate and regular rhythm.   No murmur heard. Pulmonary/Chest: Effort normal and breath sounds normal. No respiratory distress.  Abdominal: Soft. There is no tenderness. There is no rebound and no guarding.  Musculoskeletal:  2+ DP pulses bilaterally.  Swelling and tenderness to the left knee and thigh.  Neurological: She is alert and oriented to person, place, and time.  Skin: Skin is warm and dry.  Psychiatric: She has a normal mood and affect. Her behavior is normal.  Nursing note and vitals reviewed.     ED Treatments / Results  Labs (all labs ordered are listed, but only abnormal results are displayed) Labs Reviewed  BASIC METABOLIC PANEL - Abnormal; Notable for the following:       Result Value   Glucose, Bld 114 (*)    All other components within normal limits  CBC WITH DIFFERENTIAL/PLATELET - Abnormal; Notable for the following:    RDW 16.5 (*)    All other components within normal limits    EKG  EKG Interpretation None       Radiology Dg Femur Min 2 Views Left  Result Date: 06/24/2017 CLINICAL DATA:  Sudden pain and deformity upon standing up this evening. EXAM: LEFT FEMUR 2 VIEWS COMPARISON:  None. FINDINGS: There is a midshaft diaphyseal fracture of the left femur with moderate angulation. No overt lytic or destructive lesion to confirm a pathologic basis for the fracture, but the history is concerning. IMPRESSION: Midshaft left  femoral fracture. Electronically Signed   By: Andreas Newport M.D.   On: 06/24/2017 01:24    Procedures Procedures (including critical care time)  Medications Ordered in ED Medications  HYDROmorphone (DILAUDID) injection 1 mg (1 mg Intravenous Given 06/24/17 0043)  HYDROmorphone (DILAUDID) injection 1 mg (1 mg Intravenous Given 06/24/17 0122)  HYDROmorphone (DILAUDID) injection 1 mg (1 mg Intravenous Given 06/24/17 0237)     Initial Impression / Assessment and Plan / ED Course  I have reviewed the triage vital signs and the nursing notes.  Pertinent  labs & imaging results that were available during my care of the patient were reviewed by me and considered in my medical decision making (see chart for details).     Patient with history of sarcoma resection and radiation to the left thigh here for evaluation of pain to the left thigh when going to stand. She is vascularly intact on examination with significant pain and swelling to the thigh and knee. Imaging demonstrates a midshaft femur fracture. She was placed in a knee immobilizer for comfort. Discussed with on-call orthopedic surgeon, Dr. Ninfa Linden. Given her recent oncology and surgical history he does not feel comfortable performing surgery as this may be a tumor recurrence. He recommends transfer to [facility] where her prior surgery and treatment has been performed.  Discussed with patient findings and studies and treatment recommendation and she is in  agreement with plan.  Discussed with Dr. Cira Servant in the [facility] Emergency Department who accepts the patient in transfer.  Final Clinical Impressions(s) / ED Diagnoses   Final diagnoses:  Closed displaced transverse fracture of shaft of left femur, initial encounter [facility]    New Prescriptions New Prescriptions   No medications on file     Quintella Reichert, MD 06/24/17 [phone]

## 2017-06-24 NOTE — ED Notes (Signed)
[facility] ETA 10 minutes-report via phone

## 2017-06-24 NOTE — ED Triage Notes (Signed)
[name]. She reports that she was standing up from a seated position on the couch and she heard a loud pop in her L leg. There is swelling noted above her L knee. She reports a history of having sarcoma removed from that area in April. A&Ox4. Pt given 2 mg morphine en route (at 1128p), 260mL NS in as well.

## 2017-06-24 NOTE — ED Notes (Signed)
Patient complaining severe pain left leg. Dr. Ralene Bathe made aware andpatient medicated with Dilaudid 1 mg IV.

## 2017-06-24 NOTE — ED Notes (Signed)
[facility] here to transport patient to [facility]

## 2017-07-12 DIAGNOSIS — Z882 Allergy status to sulfonamides status: Secondary | ICD-10-CM | POA: Diagnosis not present

## 2017-07-12 DIAGNOSIS — Z9889 Other specified postprocedural states: Secondary | ICD-10-CM | POA: Diagnosis not present

## 2017-07-12 DIAGNOSIS — E7800 Pure hypercholesterolemia, unspecified: Secondary | ICD-10-CM | POA: Diagnosis not present

## 2017-07-12 DIAGNOSIS — M84452A Pathological fracture, left femur, initial encounter for fracture: Secondary | ICD-10-CM | POA: Diagnosis not present

## 2017-07-12 DIAGNOSIS — Z79899 Other long term (current) drug therapy: Secondary | ICD-10-CM | POA: Diagnosis not present

## 2017-07-12 DIAGNOSIS — Z87891 Personal history of nicotine dependence: Secondary | ICD-10-CM | POA: Diagnosis not present

## 2017-07-12 DIAGNOSIS — Z7982 Long term (current) use of aspirin: Secondary | ICD-10-CM | POA: Diagnosis not present

## 2017-07-12 DIAGNOSIS — Z9221 Personal history of antineoplastic chemotherapy: Secondary | ICD-10-CM | POA: Diagnosis not present

## 2017-07-12 DIAGNOSIS — I10 Essential (primary) hypertension: Secondary | ICD-10-CM | POA: Diagnosis not present

## 2017-07-12 DIAGNOSIS — Z483 Aftercare following surgery for neoplasm: Secondary | ICD-10-CM | POA: Diagnosis not present

## 2017-07-13 DIAGNOSIS — M1990 Unspecified osteoarthritis, unspecified site: Secondary | ICD-10-CM | POA: Diagnosis not present

## 2017-07-13 DIAGNOSIS — F329 Major depressive disorder, single episode, unspecified: Secondary | ICD-10-CM | POA: Diagnosis not present

## 2017-07-13 DIAGNOSIS — I119 Hypertensive heart disease without heart failure: Secondary | ICD-10-CM | POA: Diagnosis not present

## 2017-07-13 DIAGNOSIS — F419 Anxiety disorder, unspecified: Secondary | ICD-10-CM | POA: Diagnosis not present

## 2017-07-13 DIAGNOSIS — M84452D Pathological fracture, left femur, subsequent encounter for fracture with routine healing: Secondary | ICD-10-CM | POA: Diagnosis not present

## 2017-07-21 DIAGNOSIS — M1990 Unspecified osteoarthritis, unspecified site: Secondary | ICD-10-CM | POA: Diagnosis not present

## 2017-07-21 DIAGNOSIS — Z6822 Body mass index (BMI) 22.0-22.9, adult: Secondary | ICD-10-CM | POA: Diagnosis not present

## 2017-07-21 DIAGNOSIS — M84452D Pathological fracture, left femur, subsequent encounter for fracture with routine healing: Secondary | ICD-10-CM | POA: Diagnosis not present

## 2017-07-21 DIAGNOSIS — F329 Major depressive disorder, single episode, unspecified: Secondary | ICD-10-CM | POA: Diagnosis not present

## 2017-07-21 DIAGNOSIS — F419 Anxiety disorder, unspecified: Secondary | ICD-10-CM | POA: Diagnosis not present

## 2017-07-21 DIAGNOSIS — M792 Neuralgia and neuritis, unspecified: Secondary | ICD-10-CM | POA: Diagnosis not present

## 2017-07-21 DIAGNOSIS — I119 Hypertensive heart disease without heart failure: Secondary | ICD-10-CM | POA: Diagnosis not present

## 2017-07-21 DIAGNOSIS — M150 Primary generalized (osteo)arthritis: Secondary | ICD-10-CM | POA: Diagnosis not present

## 2017-07-23 DIAGNOSIS — M84452D Pathological fracture, left femur, subsequent encounter for fracture with routine healing: Secondary | ICD-10-CM | POA: Diagnosis not present

## 2017-07-23 DIAGNOSIS — F419 Anxiety disorder, unspecified: Secondary | ICD-10-CM | POA: Diagnosis not present

## 2017-07-23 DIAGNOSIS — F329 Major depressive disorder, single episode, unspecified: Secondary | ICD-10-CM | POA: Diagnosis not present

## 2017-07-23 DIAGNOSIS — I119 Hypertensive heart disease without heart failure: Secondary | ICD-10-CM | POA: Diagnosis not present

## 2017-07-23 DIAGNOSIS — M1990 Unspecified osteoarthritis, unspecified site: Secondary | ICD-10-CM | POA: Diagnosis not present

## 2017-07-26 DIAGNOSIS — M1990 Unspecified osteoarthritis, unspecified site: Secondary | ICD-10-CM | POA: Diagnosis not present

## 2017-07-26 DIAGNOSIS — F329 Major depressive disorder, single episode, unspecified: Secondary | ICD-10-CM | POA: Diagnosis not present

## 2017-07-26 DIAGNOSIS — M84452D Pathological fracture, left femur, subsequent encounter for fracture with routine healing: Secondary | ICD-10-CM | POA: Diagnosis not present

## 2017-07-26 DIAGNOSIS — F419 Anxiety disorder, unspecified: Secondary | ICD-10-CM | POA: Diagnosis not present

## 2017-07-26 DIAGNOSIS — I119 Hypertensive heart disease without heart failure: Secondary | ICD-10-CM | POA: Diagnosis not present

## 2017-07-29 DIAGNOSIS — F419 Anxiety disorder, unspecified: Secondary | ICD-10-CM | POA: Diagnosis not present

## 2017-07-29 DIAGNOSIS — F329 Major depressive disorder, single episode, unspecified: Secondary | ICD-10-CM | POA: Diagnosis not present

## 2017-07-29 DIAGNOSIS — M84452D Pathological fracture, left femur, subsequent encounter for fracture with routine healing: Secondary | ICD-10-CM | POA: Diagnosis not present

## 2017-07-29 DIAGNOSIS — I119 Hypertensive heart disease without heart failure: Secondary | ICD-10-CM | POA: Diagnosis not present

## 2017-07-29 DIAGNOSIS — M1990 Unspecified osteoarthritis, unspecified site: Secondary | ICD-10-CM | POA: Diagnosis not present

## 2017-08-02 DIAGNOSIS — M1990 Unspecified osteoarthritis, unspecified site: Secondary | ICD-10-CM | POA: Diagnosis not present

## 2017-08-02 DIAGNOSIS — I119 Hypertensive heart disease without heart failure: Secondary | ICD-10-CM | POA: Diagnosis not present

## 2017-08-02 DIAGNOSIS — M84452D Pathological fracture, left femur, subsequent encounter for fracture with routine healing: Secondary | ICD-10-CM | POA: Diagnosis not present

## 2017-08-02 DIAGNOSIS — F419 Anxiety disorder, unspecified: Secondary | ICD-10-CM | POA: Diagnosis not present

## 2017-08-02 DIAGNOSIS — F329 Major depressive disorder, single episode, unspecified: Secondary | ICD-10-CM | POA: Diagnosis not present

## 2017-08-03 DIAGNOSIS — M84452D Pathological fracture, left femur, subsequent encounter for fracture with routine healing: Secondary | ICD-10-CM | POA: Diagnosis not present

## 2017-08-03 DIAGNOSIS — F419 Anxiety disorder, unspecified: Secondary | ICD-10-CM | POA: Diagnosis not present

## 2017-08-03 DIAGNOSIS — F329 Major depressive disorder, single episode, unspecified: Secondary | ICD-10-CM | POA: Diagnosis not present

## 2017-08-03 DIAGNOSIS — M1990 Unspecified osteoarthritis, unspecified site: Secondary | ICD-10-CM | POA: Diagnosis not present

## 2017-08-03 DIAGNOSIS — I119 Hypertensive heart disease without heart failure: Secondary | ICD-10-CM | POA: Diagnosis not present

## 2017-08-09 DIAGNOSIS — Z9889 Other specified postprocedural states: Secondary | ICD-10-CM | POA: Diagnosis not present

## 2017-08-09 DIAGNOSIS — Z9221 Personal history of antineoplastic chemotherapy: Secondary | ICD-10-CM | POA: Diagnosis not present

## 2017-08-09 DIAGNOSIS — E7800 Pure hypercholesterolemia, unspecified: Secondary | ICD-10-CM | POA: Diagnosis not present

## 2017-08-09 DIAGNOSIS — Z7982 Long term (current) use of aspirin: Secondary | ICD-10-CM | POA: Diagnosis not present

## 2017-08-09 DIAGNOSIS — M84452D Pathological fracture, left femur, subsequent encounter for fracture with routine healing: Secondary | ICD-10-CM | POA: Diagnosis not present

## 2017-08-09 DIAGNOSIS — M84452A Pathological fracture, left femur, initial encounter for fracture: Secondary | ICD-10-CM | POA: Diagnosis not present

## 2017-08-09 DIAGNOSIS — Z79899 Other long term (current) drug therapy: Secondary | ICD-10-CM | POA: Diagnosis not present

## 2017-08-09 DIAGNOSIS — Z85831 Personal history of malignant neoplasm of soft tissue: Secondary | ICD-10-CM | POA: Diagnosis not present

## 2017-08-09 DIAGNOSIS — Z882 Allergy status to sulfonamides status: Secondary | ICD-10-CM | POA: Diagnosis not present

## 2017-08-09 DIAGNOSIS — Z87891 Personal history of nicotine dependence: Secondary | ICD-10-CM | POA: Diagnosis not present

## 2017-08-09 DIAGNOSIS — I10 Essential (primary) hypertension: Secondary | ICD-10-CM | POA: Diagnosis not present

## 2017-08-10 DIAGNOSIS — M84452D Pathological fracture, left femur, subsequent encounter for fracture with routine healing: Secondary | ICD-10-CM | POA: Diagnosis not present

## 2017-08-10 DIAGNOSIS — M1990 Unspecified osteoarthritis, unspecified site: Secondary | ICD-10-CM | POA: Diagnosis not present

## 2017-08-10 DIAGNOSIS — I119 Hypertensive heart disease without heart failure: Secondary | ICD-10-CM | POA: Diagnosis not present

## 2017-08-10 DIAGNOSIS — F419 Anxiety disorder, unspecified: Secondary | ICD-10-CM | POA: Diagnosis not present

## 2017-08-10 DIAGNOSIS — F329 Major depressive disorder, single episode, unspecified: Secondary | ICD-10-CM | POA: Diagnosis not present

## 2017-08-12 DIAGNOSIS — F329 Major depressive disorder, single episode, unspecified: Secondary | ICD-10-CM | POA: Diagnosis not present

## 2017-08-12 DIAGNOSIS — F419 Anxiety disorder, unspecified: Secondary | ICD-10-CM | POA: Diagnosis not present

## 2017-08-12 DIAGNOSIS — M84452D Pathological fracture, left femur, subsequent encounter for fracture with routine healing: Secondary | ICD-10-CM | POA: Diagnosis not present

## 2017-08-12 DIAGNOSIS — M1990 Unspecified osteoarthritis, unspecified site: Secondary | ICD-10-CM | POA: Diagnosis not present

## 2017-08-12 DIAGNOSIS — I119 Hypertensive heart disease without heart failure: Secondary | ICD-10-CM | POA: Diagnosis not present

## 2017-08-15 DIAGNOSIS — N951 Menopausal and female climacteric states: Secondary | ICD-10-CM | POA: Diagnosis not present

## 2017-08-16 DIAGNOSIS — Z8249 Family history of ischemic heart disease and other diseases of the circulatory system: Secondary | ICD-10-CM | POA: Diagnosis not present

## 2017-08-16 DIAGNOSIS — R82998 Other abnormal findings in urine: Secondary | ICD-10-CM | POA: Diagnosis not present

## 2017-08-16 DIAGNOSIS — E7849 Other hyperlipidemia: Secondary | ICD-10-CM | POA: Diagnosis not present

## 2017-08-16 DIAGNOSIS — I10 Essential (primary) hypertension: Secondary | ICD-10-CM | POA: Diagnosis not present

## 2017-08-23 DIAGNOSIS — I10 Essential (primary) hypertension: Secondary | ICD-10-CM | POA: Diagnosis not present

## 2017-08-23 DIAGNOSIS — M5090 Cervical disc disorder, unspecified, unspecified cervical region: Secondary | ICD-10-CM | POA: Diagnosis not present

## 2017-08-23 DIAGNOSIS — Z8249 Family history of ischemic heart disease and other diseases of the circulatory system: Secondary | ICD-10-CM | POA: Diagnosis not present

## 2017-08-23 DIAGNOSIS — Z23 Encounter for immunization: Secondary | ICD-10-CM | POA: Diagnosis not present

## 2017-08-23 DIAGNOSIS — N951 Menopausal and female climacteric states: Secondary | ICD-10-CM | POA: Diagnosis not present

## 2017-08-23 DIAGNOSIS — Z1389 Encounter for screening for other disorder: Secondary | ICD-10-CM | POA: Diagnosis not present

## 2017-08-23 DIAGNOSIS — Z6823 Body mass index (BMI) 23.0-23.9, adult: Secondary | ICD-10-CM | POA: Diagnosis not present

## 2017-08-23 DIAGNOSIS — C499 Malignant neoplasm of connective and soft tissue, unspecified: Secondary | ICD-10-CM | POA: Diagnosis not present

## 2017-08-23 DIAGNOSIS — K219 Gastro-esophageal reflux disease without esophagitis: Secondary | ICD-10-CM | POA: Diagnosis not present

## 2017-08-23 DIAGNOSIS — Z0000 Encounter for general adult medical examination without abnormal findings: Secondary | ICD-10-CM | POA: Diagnosis not present

## 2017-08-23 DIAGNOSIS — E7849 Other hyperlipidemia: Secondary | ICD-10-CM | POA: Diagnosis not present

## 2017-08-23 DIAGNOSIS — M84452S Pathological fracture, left femur, sequela: Secondary | ICD-10-CM | POA: Diagnosis not present

## 2017-08-25 DIAGNOSIS — Z1212 Encounter for screening for malignant neoplasm of rectum: Secondary | ICD-10-CM | POA: Diagnosis not present

## 2017-09-01 DIAGNOSIS — H2513 Age-related nuclear cataract, bilateral: Secondary | ICD-10-CM | POA: Diagnosis not present

## 2017-09-22 DIAGNOSIS — Z1231 Encounter for screening mammogram for malignant neoplasm of breast: Secondary | ICD-10-CM | POA: Diagnosis not present

## 2017-09-26 DIAGNOSIS — M533 Sacrococcygeal disorders, not elsewhere classified: Secondary | ICD-10-CM | POA: Diagnosis not present

## 2017-09-26 DIAGNOSIS — C4922 Malignant neoplasm of connective and soft tissue of left lower limb, including hip: Secondary | ICD-10-CM | POA: Diagnosis not present

## 2017-09-26 DIAGNOSIS — M84452D Pathological fracture, left femur, subsequent encounter for fracture with routine healing: Secondary | ICD-10-CM | POA: Diagnosis not present

## 2017-09-26 DIAGNOSIS — M1612 Unilateral primary osteoarthritis, left hip: Secondary | ICD-10-CM | POA: Diagnosis not present

## 2017-09-26 DIAGNOSIS — Z483 Aftercare following surgery for neoplasm: Secondary | ICD-10-CM | POA: Diagnosis not present

## 2017-10-19 ENCOUNTER — Other Ambulatory Visit: Payer: Self-pay | Admitting: Dermatology

## 2017-10-19 DIAGNOSIS — L570 Actinic keratosis: Secondary | ICD-10-CM | POA: Diagnosis not present

## 2017-10-19 DIAGNOSIS — D485 Neoplasm of uncertain behavior of skin: Secondary | ICD-10-CM | POA: Diagnosis not present

## 2017-10-24 DIAGNOSIS — M792 Neuralgia and neuritis, unspecified: Secondary | ICD-10-CM | POA: Diagnosis not present

## 2017-10-24 DIAGNOSIS — Z6824 Body mass index (BMI) 24.0-24.9, adult: Secondary | ICD-10-CM | POA: Diagnosis not present

## 2017-10-24 DIAGNOSIS — M150 Primary generalized (osteo)arthritis: Secondary | ICD-10-CM | POA: Diagnosis not present

## 2017-11-25 DIAGNOSIS — J322 Chronic ethmoidal sinusitis: Secondary | ICD-10-CM | POA: Diagnosis not present

## 2017-11-25 DIAGNOSIS — J320 Chronic maxillary sinusitis: Secondary | ICD-10-CM | POA: Diagnosis not present

## 2017-11-25 DIAGNOSIS — J370 Chronic laryngitis: Secondary | ICD-10-CM | POA: Diagnosis not present

## 2017-12-09 ENCOUNTER — Other Ambulatory Visit: Payer: Self-pay | Admitting: Family Medicine

## 2017-12-09 ENCOUNTER — Ambulatory Visit
Admission: RE | Admit: 2017-12-09 | Discharge: 2017-12-09 | Disposition: A | Discharge status: Home / Self Care | Payer: Medicare Other | Source: Ambulatory Visit | Attending: Family Medicine | Admitting: Family Medicine

## 2017-12-09 DIAGNOSIS — R51 Headache: Principal | ICD-10-CM

## 2017-12-09 DIAGNOSIS — R519 Headache, unspecified: Secondary | ICD-10-CM

## 2017-12-09 DIAGNOSIS — J111 Influenza due to unidentified influenza virus with other respiratory manifestations: Secondary | ICD-10-CM | POA: Diagnosis not present

## 2017-12-09 DIAGNOSIS — J329 Chronic sinusitis, unspecified: Secondary | ICD-10-CM | POA: Diagnosis not present

## 2017-12-09 DIAGNOSIS — E876 Hypokalemia: Secondary | ICD-10-CM | POA: Diagnosis not present

## 2017-12-09 DIAGNOSIS — R112 Nausea with vomiting, unspecified: Secondary | ICD-10-CM

## 2017-12-09 IMAGING — CT CT HEAD WO/W CM
1 of 2 series · 13 of 30 positions shown, 17 images · IV contrast (iopamidol)
Comparison: CT head without contrast 04/10/2017.

ADDENDUM:
Voice recognition error: The second sentence of the impression
section should read "No acute intracranial abnormality. "
CLINICAL DATA: Severe frontal headache. Nausea and vomiting.
Sarcoma of the left lower extremity 7920.

EXAM:
CT HEAD WITHOUT AND WITH CONTRAST
TECHNIQUE: Contiguous axial images were obtained from the base of the skull
through the vertex without and with intravenous contrast
CONTRAST:  75mL G2SCRI-V55 IOPAMIDOL (G2SCRI-V55) INJECTION 61%

[Series 2: head w/(date) · axial · 0.43mm/px · z∈[-167,-47]mm · 13 of 30 slices shown, 17 images]
[im 3/30  brain]
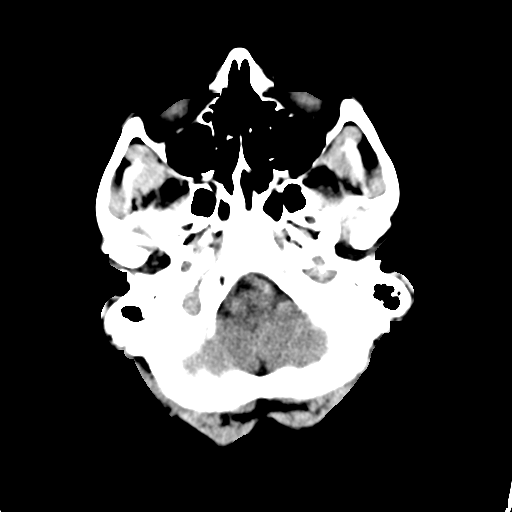
[im 3/30  bone]
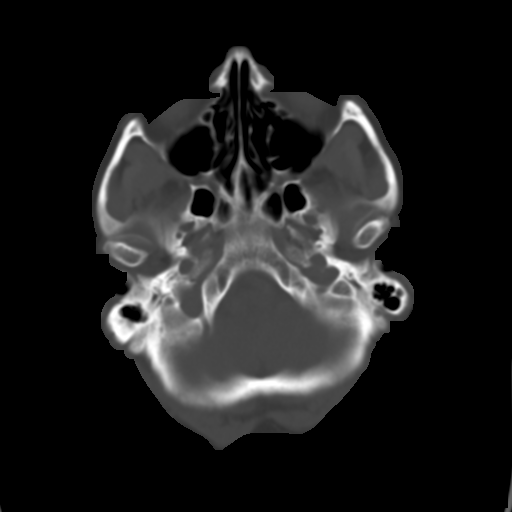
[im 5/30  brain]
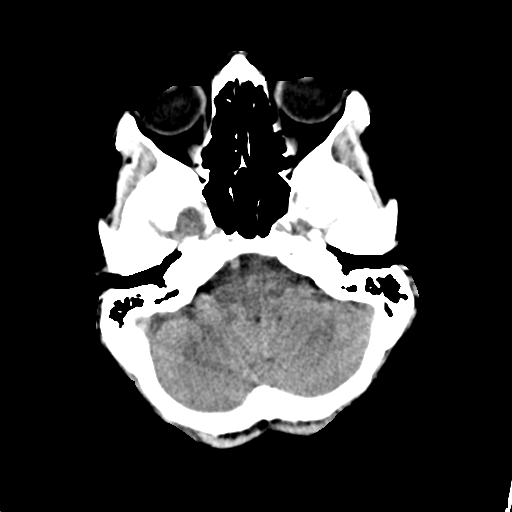
[im 7/30  brain]
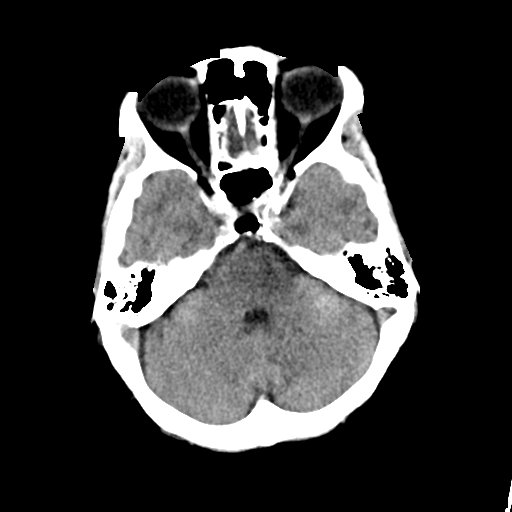
[im 9/30  brain]
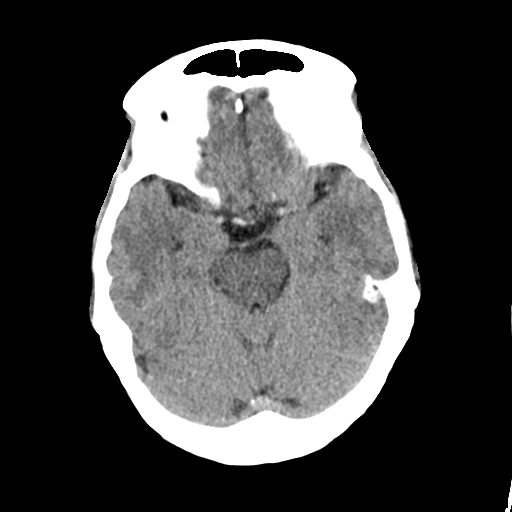
[im 11/30  brain]
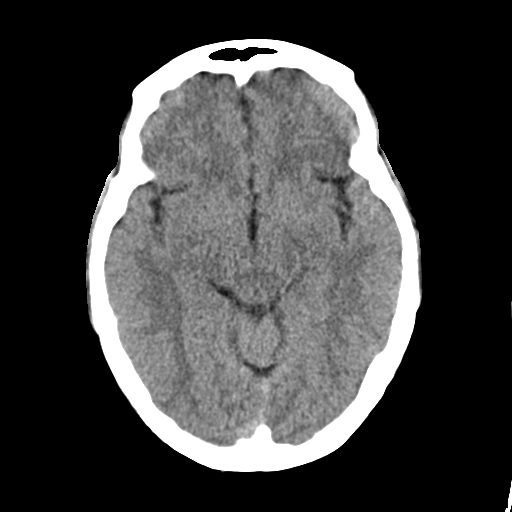
[im 11/30  bone]
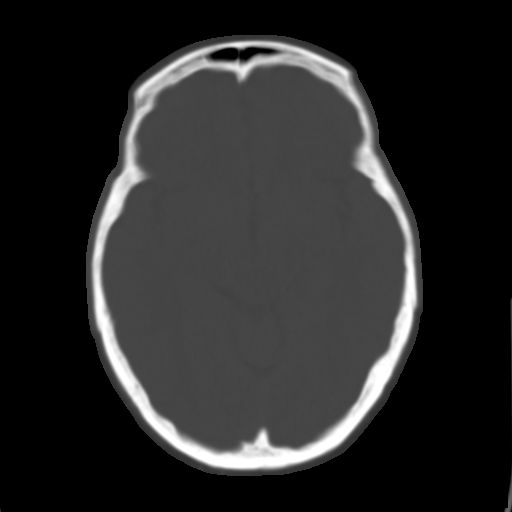
[im 13/30  brain]
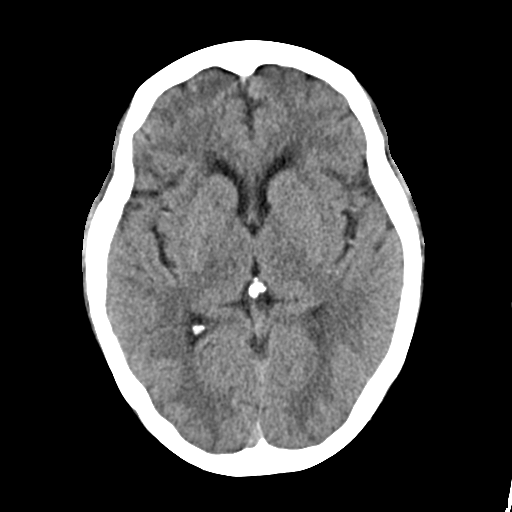
[im 15/30  brain]
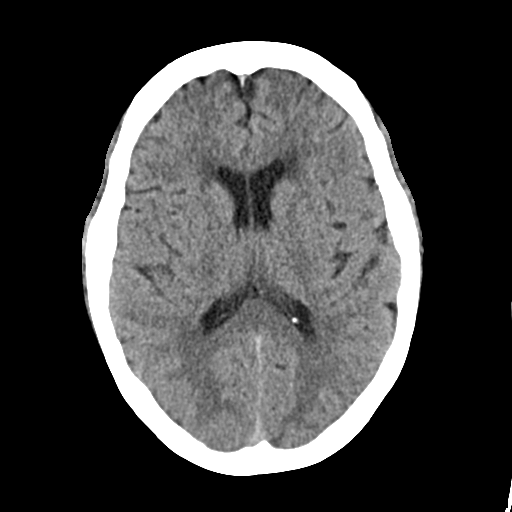
[im 17/30  brain]
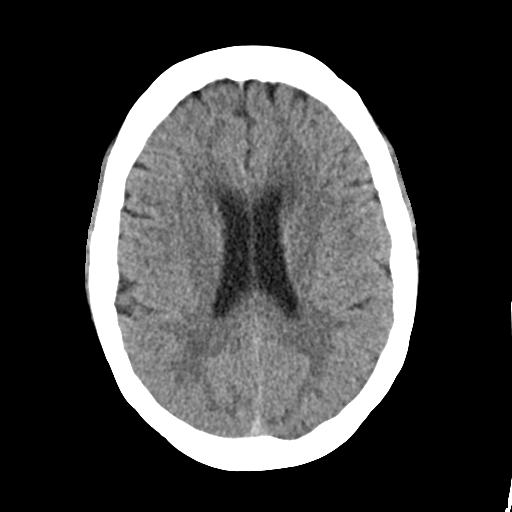
[im 19/30  brain]
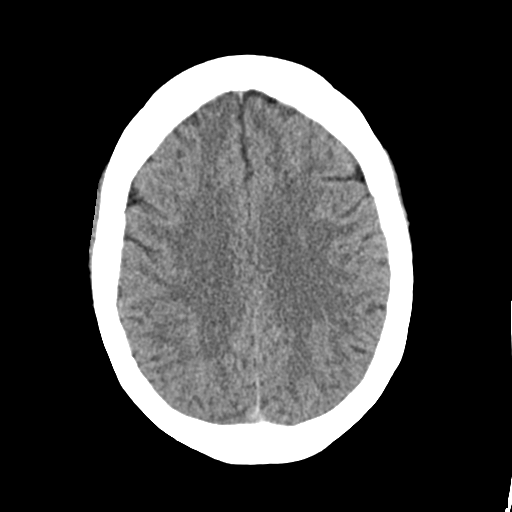
[im 19/30  bone]
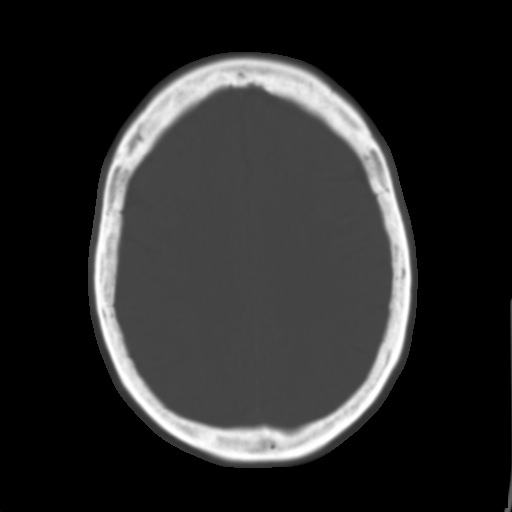
[im 21/30  brain]
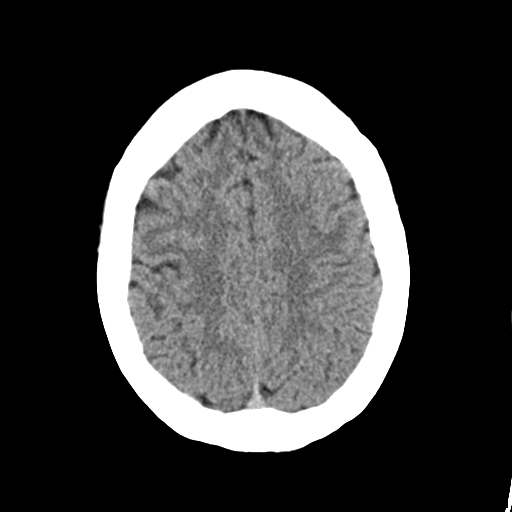
[im 23/30  brain]
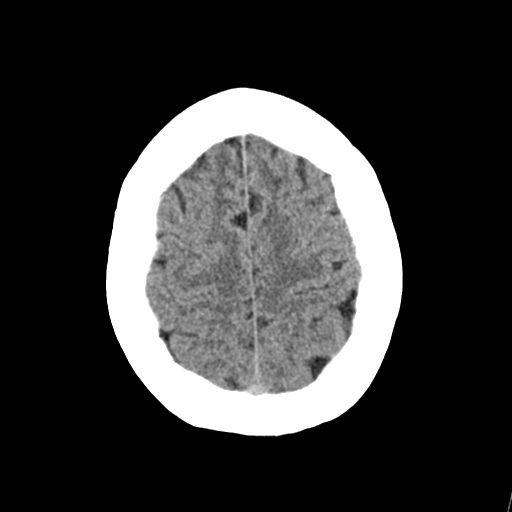
[im 25/30  brain]
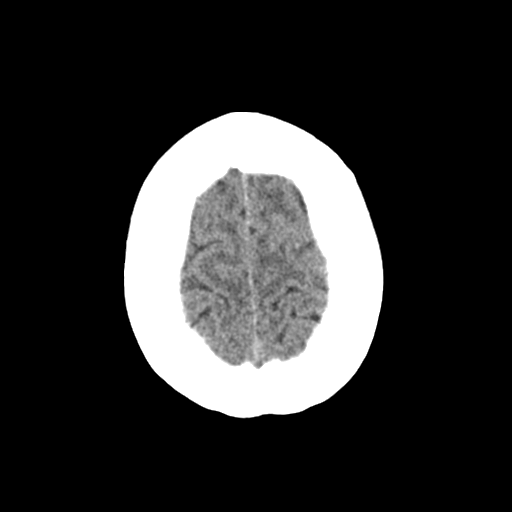
[im 27/30  brain]
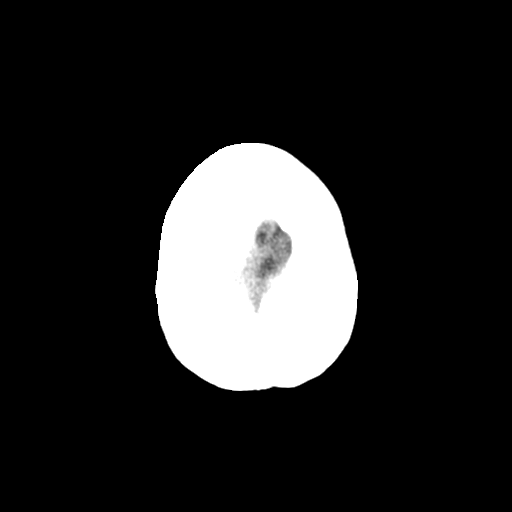
[im 27/30  bone]
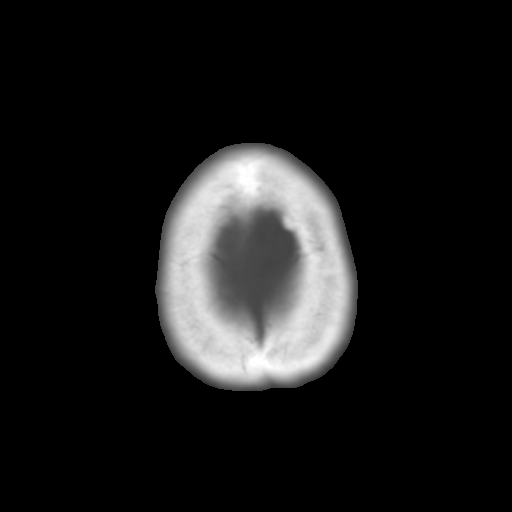

[13 of 30 positions shown; findings below may reference images not displayed]

FINDINGS: Brain: Mild atrophy and white matter changes are stable. Asymmetric
white matter hypoattenuation in the anterior limb of the left
internal capsule is stable. The basal ganglia and internal capsule
are normal bilaterally. The postcontrast images demonstrate no
pathologic enhancement.

No acute infarct, hemorrhage, or mass lesion is present. Ventricles
are of normal size.

Vascular: Atherosclerotic calcifications are present within the
cavernous internal carotid arteries bilaterally. There is no
hyperdense vessel.

Skull: Calvarium is intact. No focal lytic or blastic lesions are
present.

Sinuses/Orbits: The paranasal sinuses and mastoid air cells are
clear. Globes and orbits are within normal limits.
IMPRESSION: 1. Stable mild atrophy and white matter changes. This likely
reflects the sequela of chronic microvascular ischemia.
2. Acute intracranial abnormality.
3. No evidence for metastatic disease to the brain.

## 2017-12-09 MED ORDER — IOPAMIDOL (ISOVUE-300) INJECTION 61%
75.0000 mL | Freq: Once | INTRAVENOUS | Status: AC | PRN
  Administered 2017-12-09: 75 mL via INTRAVENOUS

## 2018-01-18 DIAGNOSIS — M792 Neuralgia and neuritis, unspecified: Secondary | ICD-10-CM | POA: Diagnosis not present

## 2018-01-18 DIAGNOSIS — Z6824 Body mass index (BMI) 24.0-24.9, adult: Secondary | ICD-10-CM | POA: Diagnosis not present

## 2018-01-18 DIAGNOSIS — Z79899 Other long term (current) drug therapy: Secondary | ICD-10-CM | POA: Diagnosis not present

## 2018-01-18 DIAGNOSIS — M150 Primary generalized (osteo)arthritis: Secondary | ICD-10-CM | POA: Diagnosis not present

## 2018-02-13 DIAGNOSIS — C4922 Malignant neoplasm of connective and soft tissue of left lower limb, including hip: Secondary | ICD-10-CM | POA: Diagnosis not present

## 2018-02-13 DIAGNOSIS — Z483 Aftercare following surgery for neoplasm: Secondary | ICD-10-CM | POA: Diagnosis not present

## 2018-02-13 DIAGNOSIS — S72392D Other fracture of shaft of left femur, subsequent encounter for closed fracture with routine healing: Secondary | ICD-10-CM | POA: Diagnosis not present

## 2018-02-13 DIAGNOSIS — M84452D Pathological fracture, left femur, subsequent encounter for fracture with routine healing: Secondary | ICD-10-CM | POA: Diagnosis not present

## 2018-02-13 DIAGNOSIS — M1612 Unilateral primary osteoarthritis, left hip: Secondary | ICD-10-CM | POA: Diagnosis not present

## 2018-03-28 DIAGNOSIS — M542 Cervicalgia: Secondary | ICD-10-CM | POA: Diagnosis not present

## 2018-04-17 ENCOUNTER — Other Ambulatory Visit: Payer: Self-pay | Admitting: Dermatology

## 2018-04-17 DIAGNOSIS — D485 Neoplasm of uncertain behavior of skin: Secondary | ICD-10-CM | POA: Diagnosis not present

## 2018-04-17 DIAGNOSIS — L570 Actinic keratosis: Secondary | ICD-10-CM | POA: Diagnosis not present

## 2018-04-18 DIAGNOSIS — M25462 Effusion, left knee: Secondary | ICD-10-CM | POA: Diagnosis not present

## 2018-04-18 DIAGNOSIS — M25562 Pain in left knee: Secondary | ICD-10-CM | POA: Diagnosis not present

## 2018-04-18 DIAGNOSIS — Z91040 Latex allergy status: Secondary | ICD-10-CM | POA: Diagnosis not present

## 2018-04-18 DIAGNOSIS — Z882 Allergy status to sulfonamides status: Secondary | ICD-10-CM | POA: Diagnosis not present

## 2018-04-26 DIAGNOSIS — M792 Neuralgia and neuritis, unspecified: Secondary | ICD-10-CM | POA: Diagnosis not present

## 2018-04-26 DIAGNOSIS — M150 Primary generalized (osteo)arthritis: Secondary | ICD-10-CM | POA: Diagnosis not present

## 2018-04-26 DIAGNOSIS — Z6824 Body mass index (BMI) 24.0-24.9, adult: Secondary | ICD-10-CM | POA: Diagnosis not present

## 2018-05-12 DIAGNOSIS — M47898 Other spondylosis, sacral and sacrococcygeal region: Secondary | ICD-10-CM | POA: Diagnosis not present

## 2018-05-12 DIAGNOSIS — M1612 Unilateral primary osteoarthritis, left hip: Secondary | ICD-10-CM | POA: Diagnosis not present

## 2018-05-12 DIAGNOSIS — M25562 Pain in left knee: Secondary | ICD-10-CM | POA: Diagnosis not present

## 2018-05-12 DIAGNOSIS — T84125D Displacement of internal fixation device of left femur, subsequent encounter: Secondary | ICD-10-CM | POA: Diagnosis not present

## 2018-05-12 DIAGNOSIS — S72392D Other fracture of shaft of left femur, subsequent encounter for closed fracture with routine healing: Secondary | ICD-10-CM | POA: Diagnosis not present

## 2018-05-12 DIAGNOSIS — M1712 Unilateral primary osteoarthritis, left knee: Secondary | ICD-10-CM | POA: Diagnosis not present

## 2018-05-12 DIAGNOSIS — M25462 Effusion, left knee: Secondary | ICD-10-CM | POA: Diagnosis not present

## 2018-05-16 DIAGNOSIS — I10 Essential (primary) hypertension: Secondary | ICD-10-CM | POA: Diagnosis not present

## 2018-05-16 DIAGNOSIS — M84452K Pathological fracture, left femur, subsequent encounter for fracture with nonunion: Secondary | ICD-10-CM | POA: Diagnosis not present

## 2018-05-16 DIAGNOSIS — M84452D Pathological fracture, left femur, subsequent encounter for fracture with routine healing: Secondary | ICD-10-CM | POA: Diagnosis not present

## 2018-05-18 DIAGNOSIS — T85618A Breakdown (mechanical) of other specified internal prosthetic devices, implants and grafts, initial encounter: Secondary | ICD-10-CM | POA: Diagnosis not present

## 2018-05-18 DIAGNOSIS — Z85831 Personal history of malignant neoplasm of soft tissue: Secondary | ICD-10-CM | POA: Diagnosis not present

## 2018-05-18 DIAGNOSIS — Z01818 Encounter for other preprocedural examination: Secondary | ICD-10-CM | POA: Diagnosis not present

## 2018-05-18 DIAGNOSIS — Z87891 Personal history of nicotine dependence: Secondary | ICD-10-CM | POA: Diagnosis not present

## 2018-05-18 DIAGNOSIS — M25562 Pain in left knee: Secondary | ICD-10-CM | POA: Diagnosis not present

## 2018-05-18 DIAGNOSIS — Y792 Prosthetic and other implants, materials and accessory orthopedic devices associated with adverse incidents: Secondary | ICD-10-CM | POA: Diagnosis not present

## 2018-05-18 DIAGNOSIS — M84552D Pathological fracture in neoplastic disease, left femur, subsequent encounter for fracture with routine healing: Secondary | ICD-10-CM | POA: Diagnosis not present

## 2018-05-18 DIAGNOSIS — T8484XA Pain due to internal orthopedic prosthetic devices, implants and grafts, initial encounter: Secondary | ICD-10-CM | POA: Diagnosis not present

## 2018-05-19 DIAGNOSIS — Z7982 Long term (current) use of aspirin: Secondary | ICD-10-CM | POA: Diagnosis not present

## 2018-05-19 DIAGNOSIS — M84452K Pathological fracture, left femur, subsequent encounter for fracture with nonunion: Secondary | ICD-10-CM | POA: Diagnosis present

## 2018-05-19 DIAGNOSIS — M545 Low back pain: Secondary | ICD-10-CM | POA: Diagnosis not present

## 2018-05-19 DIAGNOSIS — Z90710 Acquired absence of both cervix and uterus: Secondary | ICD-10-CM | POA: Diagnosis not present

## 2018-05-19 DIAGNOSIS — Z9221 Personal history of antineoplastic chemotherapy: Secondary | ICD-10-CM | POA: Diagnosis not present

## 2018-05-19 DIAGNOSIS — I119 Hypertensive heart disease without heart failure: Secondary | ICD-10-CM | POA: Diagnosis present

## 2018-05-19 DIAGNOSIS — Z87891 Personal history of nicotine dependence: Secondary | ICD-10-CM | POA: Diagnosis not present

## 2018-05-19 DIAGNOSIS — M84452D Pathological fracture, left femur, subsequent encounter for fracture with routine healing: Secondary | ICD-10-CM | POA: Diagnosis not present

## 2018-05-19 DIAGNOSIS — K219 Gastro-esophageal reflux disease without esophagitis: Secondary | ICD-10-CM | POA: Diagnosis present

## 2018-05-19 DIAGNOSIS — Z85831 Personal history of malignant neoplasm of soft tissue: Secondary | ICD-10-CM | POA: Diagnosis not present

## 2018-05-19 DIAGNOSIS — E785 Hyperlipidemia, unspecified: Secondary | ICD-10-CM | POA: Diagnosis present

## 2018-05-19 DIAGNOSIS — Z8249 Family history of ischemic heart disease and other diseases of the circulatory system: Secondary | ICD-10-CM | POA: Diagnosis not present

## 2018-05-19 DIAGNOSIS — G8918 Other acute postprocedural pain: Secondary | ICD-10-CM | POA: Diagnosis not present

## 2018-05-23 DIAGNOSIS — Z9181 History of falling: Secondary | ICD-10-CM | POA: Diagnosis not present

## 2018-05-23 DIAGNOSIS — I119 Hypertensive heart disease without heart failure: Secondary | ICD-10-CM | POA: Diagnosis not present

## 2018-05-23 DIAGNOSIS — F418 Other specified anxiety disorders: Secondary | ICD-10-CM | POA: Diagnosis not present

## 2018-05-23 DIAGNOSIS — F329 Major depressive disorder, single episode, unspecified: Secondary | ICD-10-CM | POA: Diagnosis not present

## 2018-05-23 DIAGNOSIS — Z7982 Long term (current) use of aspirin: Secondary | ICD-10-CM | POA: Diagnosis not present

## 2018-05-23 DIAGNOSIS — Z859 Personal history of malignant neoplasm, unspecified: Secondary | ICD-10-CM | POA: Diagnosis not present

## 2018-05-23 DIAGNOSIS — F3289 Other specified depressive episodes: Secondary | ICD-10-CM | POA: Diagnosis not present

## 2018-05-23 DIAGNOSIS — E7800 Pure hypercholesterolemia, unspecified: Secondary | ICD-10-CM | POA: Diagnosis not present

## 2018-05-23 DIAGNOSIS — Z90710 Acquired absence of both cervix and uterus: Secondary | ICD-10-CM | POA: Diagnosis not present

## 2018-05-23 DIAGNOSIS — F419 Anxiety disorder, unspecified: Secondary | ICD-10-CM | POA: Diagnosis not present

## 2018-05-23 DIAGNOSIS — Z87891 Personal history of nicotine dependence: Secondary | ICD-10-CM | POA: Diagnosis not present

## 2018-05-23 DIAGNOSIS — M84452K Pathological fracture, left femur, subsequent encounter for fracture with nonunion: Secondary | ICD-10-CM | POA: Diagnosis not present

## 2018-05-30 DIAGNOSIS — F419 Anxiety disorder, unspecified: Secondary | ICD-10-CM | POA: Diagnosis not present

## 2018-05-30 DIAGNOSIS — I119 Hypertensive heart disease without heart failure: Secondary | ICD-10-CM | POA: Diagnosis not present

## 2018-05-30 DIAGNOSIS — M84452K Pathological fracture, left femur, subsequent encounter for fracture with nonunion: Secondary | ICD-10-CM | POA: Diagnosis not present

## 2018-05-30 DIAGNOSIS — F329 Major depressive disorder, single episode, unspecified: Secondary | ICD-10-CM | POA: Diagnosis not present

## 2018-05-30 DIAGNOSIS — E7800 Pure hypercholesterolemia, unspecified: Secondary | ICD-10-CM | POA: Diagnosis not present

## 2018-05-30 DIAGNOSIS — Z90710 Acquired absence of both cervix and uterus: Secondary | ICD-10-CM | POA: Diagnosis not present

## 2018-06-06 DIAGNOSIS — M84452K Pathological fracture, left femur, subsequent encounter for fracture with nonunion: Secondary | ICD-10-CM | POA: Diagnosis not present

## 2018-06-06 DIAGNOSIS — F329 Major depressive disorder, single episode, unspecified: Secondary | ICD-10-CM | POA: Diagnosis not present

## 2018-06-06 DIAGNOSIS — Z90710 Acquired absence of both cervix and uterus: Secondary | ICD-10-CM | POA: Diagnosis not present

## 2018-06-06 DIAGNOSIS — I119 Hypertensive heart disease without heart failure: Secondary | ICD-10-CM | POA: Diagnosis not present

## 2018-06-06 DIAGNOSIS — Z4801 Encounter for change or removal of surgical wound dressing: Secondary | ICD-10-CM | POA: Diagnosis not present

## 2018-06-06 DIAGNOSIS — E7800 Pure hypercholesterolemia, unspecified: Secondary | ICD-10-CM | POA: Diagnosis not present

## 2018-06-06 DIAGNOSIS — F419 Anxiety disorder, unspecified: Secondary | ICD-10-CM | POA: Diagnosis not present

## 2018-06-08 DIAGNOSIS — E7800 Pure hypercholesterolemia, unspecified: Secondary | ICD-10-CM | POA: Diagnosis not present

## 2018-06-08 DIAGNOSIS — F329 Major depressive disorder, single episode, unspecified: Secondary | ICD-10-CM | POA: Diagnosis not present

## 2018-06-08 DIAGNOSIS — Z90710 Acquired absence of both cervix and uterus: Secondary | ICD-10-CM | POA: Diagnosis not present

## 2018-06-08 DIAGNOSIS — M84452K Pathological fracture, left femur, subsequent encounter for fracture with nonunion: Secondary | ICD-10-CM | POA: Diagnosis not present

## 2018-06-08 DIAGNOSIS — F419 Anxiety disorder, unspecified: Secondary | ICD-10-CM | POA: Diagnosis not present

## 2018-06-08 DIAGNOSIS — I119 Hypertensive heart disease without heart failure: Secondary | ICD-10-CM | POA: Diagnosis not present

## 2018-06-13 DIAGNOSIS — F329 Major depressive disorder, single episode, unspecified: Secondary | ICD-10-CM | POA: Diagnosis not present

## 2018-06-13 DIAGNOSIS — E7800 Pure hypercholesterolemia, unspecified: Secondary | ICD-10-CM | POA: Diagnosis not present

## 2018-06-13 DIAGNOSIS — M84452K Pathological fracture, left femur, subsequent encounter for fracture with nonunion: Secondary | ICD-10-CM | POA: Diagnosis not present

## 2018-06-13 DIAGNOSIS — F419 Anxiety disorder, unspecified: Secondary | ICD-10-CM | POA: Diagnosis not present

## 2018-06-13 DIAGNOSIS — Z90710 Acquired absence of both cervix and uterus: Secondary | ICD-10-CM | POA: Diagnosis not present

## 2018-06-13 DIAGNOSIS — I119 Hypertensive heart disease without heart failure: Secondary | ICD-10-CM | POA: Diagnosis not present

## 2018-06-15 DIAGNOSIS — I119 Hypertensive heart disease without heart failure: Secondary | ICD-10-CM | POA: Diagnosis not present

## 2018-06-15 DIAGNOSIS — F419 Anxiety disorder, unspecified: Secondary | ICD-10-CM | POA: Diagnosis not present

## 2018-06-15 DIAGNOSIS — Z90710 Acquired absence of both cervix and uterus: Secondary | ICD-10-CM | POA: Diagnosis not present

## 2018-06-15 DIAGNOSIS — F329 Major depressive disorder, single episode, unspecified: Secondary | ICD-10-CM | POA: Diagnosis not present

## 2018-06-15 DIAGNOSIS — M84452K Pathological fracture, left femur, subsequent encounter for fracture with nonunion: Secondary | ICD-10-CM | POA: Diagnosis not present

## 2018-06-15 DIAGNOSIS — E7800 Pure hypercholesterolemia, unspecified: Secondary | ICD-10-CM | POA: Diagnosis not present

## 2018-06-20 DIAGNOSIS — M84452K Pathological fracture, left femur, subsequent encounter for fracture with nonunion: Secondary | ICD-10-CM | POA: Diagnosis not present

## 2018-06-20 DIAGNOSIS — Z90710 Acquired absence of both cervix and uterus: Secondary | ICD-10-CM | POA: Diagnosis not present

## 2018-06-20 DIAGNOSIS — F329 Major depressive disorder, single episode, unspecified: Secondary | ICD-10-CM | POA: Diagnosis not present

## 2018-06-20 DIAGNOSIS — I119 Hypertensive heart disease without heart failure: Secondary | ICD-10-CM | POA: Diagnosis not present

## 2018-06-20 DIAGNOSIS — E7800 Pure hypercholesterolemia, unspecified: Secondary | ICD-10-CM | POA: Diagnosis not present

## 2018-06-20 DIAGNOSIS — F419 Anxiety disorder, unspecified: Secondary | ICD-10-CM | POA: Diagnosis not present

## 2018-06-22 DIAGNOSIS — M84452K Pathological fracture, left femur, subsequent encounter for fracture with nonunion: Secondary | ICD-10-CM | POA: Diagnosis not present

## 2018-06-22 DIAGNOSIS — I119 Hypertensive heart disease without heart failure: Secondary | ICD-10-CM | POA: Diagnosis not present

## 2018-06-22 DIAGNOSIS — Z90710 Acquired absence of both cervix and uterus: Secondary | ICD-10-CM | POA: Diagnosis not present

## 2018-06-22 DIAGNOSIS — E7800 Pure hypercholesterolemia, unspecified: Secondary | ICD-10-CM | POA: Diagnosis not present

## 2018-06-22 DIAGNOSIS — F419 Anxiety disorder, unspecified: Secondary | ICD-10-CM | POA: Diagnosis not present

## 2018-06-22 DIAGNOSIS — F329 Major depressive disorder, single episode, unspecified: Secondary | ICD-10-CM | POA: Diagnosis not present

## 2018-06-28 DIAGNOSIS — F419 Anxiety disorder, unspecified: Secondary | ICD-10-CM | POA: Diagnosis not present

## 2018-06-28 DIAGNOSIS — J322 Chronic ethmoidal sinusitis: Secondary | ICD-10-CM | POA: Diagnosis not present

## 2018-06-28 DIAGNOSIS — J320 Chronic maxillary sinusitis: Secondary | ICD-10-CM | POA: Diagnosis not present

## 2018-06-28 DIAGNOSIS — E7800 Pure hypercholesterolemia, unspecified: Secondary | ICD-10-CM | POA: Diagnosis not present

## 2018-06-28 DIAGNOSIS — M84452K Pathological fracture, left femur, subsequent encounter for fracture with nonunion: Secondary | ICD-10-CM | POA: Diagnosis not present

## 2018-06-28 DIAGNOSIS — F329 Major depressive disorder, single episode, unspecified: Secondary | ICD-10-CM | POA: Diagnosis not present

## 2018-06-28 DIAGNOSIS — J370 Chronic laryngitis: Secondary | ICD-10-CM | POA: Diagnosis not present

## 2018-06-28 DIAGNOSIS — Z90710 Acquired absence of both cervix and uterus: Secondary | ICD-10-CM | POA: Diagnosis not present

## 2018-06-28 DIAGNOSIS — I119 Hypertensive heart disease without heart failure: Secondary | ICD-10-CM | POA: Diagnosis not present

## 2018-06-28 DIAGNOSIS — H8113 Benign paroxysmal vertigo, bilateral: Secondary | ICD-10-CM | POA: Diagnosis not present

## 2018-06-30 DIAGNOSIS — E7800 Pure hypercholesterolemia, unspecified: Secondary | ICD-10-CM | POA: Diagnosis not present

## 2018-06-30 DIAGNOSIS — I119 Hypertensive heart disease without heart failure: Secondary | ICD-10-CM | POA: Diagnosis not present

## 2018-06-30 DIAGNOSIS — Z90710 Acquired absence of both cervix and uterus: Secondary | ICD-10-CM | POA: Diagnosis not present

## 2018-06-30 DIAGNOSIS — M84452K Pathological fracture, left femur, subsequent encounter for fracture with nonunion: Secondary | ICD-10-CM | POA: Diagnosis not present

## 2018-06-30 DIAGNOSIS — F419 Anxiety disorder, unspecified: Secondary | ICD-10-CM | POA: Diagnosis not present

## 2018-06-30 DIAGNOSIS — F329 Major depressive disorder, single episode, unspecified: Secondary | ICD-10-CM | POA: Diagnosis not present

## 2018-07-03 DIAGNOSIS — I119 Hypertensive heart disease without heart failure: Secondary | ICD-10-CM | POA: Diagnosis not present

## 2018-07-03 DIAGNOSIS — E7800 Pure hypercholesterolemia, unspecified: Secondary | ICD-10-CM | POA: Diagnosis not present

## 2018-07-03 DIAGNOSIS — M84452K Pathological fracture, left femur, subsequent encounter for fracture with nonunion: Secondary | ICD-10-CM | POA: Diagnosis not present

## 2018-07-03 DIAGNOSIS — F419 Anxiety disorder, unspecified: Secondary | ICD-10-CM | POA: Diagnosis not present

## 2018-07-03 DIAGNOSIS — F329 Major depressive disorder, single episode, unspecified: Secondary | ICD-10-CM | POA: Diagnosis not present

## 2018-07-03 DIAGNOSIS — Z90710 Acquired absence of both cervix and uterus: Secondary | ICD-10-CM | POA: Diagnosis not present

## 2018-07-04 DIAGNOSIS — C4922 Malignant neoplasm of connective and soft tissue of left lower limb, including hip: Secondary | ICD-10-CM | POA: Diagnosis not present

## 2018-07-04 DIAGNOSIS — M84452A Pathological fracture, left femur, initial encounter for fracture: Secondary | ICD-10-CM | POA: Diagnosis not present

## 2018-07-04 DIAGNOSIS — M47898 Other spondylosis, sacral and sacrococcygeal region: Secondary | ICD-10-CM | POA: Diagnosis not present

## 2018-07-04 DIAGNOSIS — M84452K Pathological fracture, left femur, subsequent encounter for fracture with nonunion: Secondary | ICD-10-CM | POA: Diagnosis not present

## 2018-07-04 DIAGNOSIS — M1612 Unilateral primary osteoarthritis, left hip: Secondary | ICD-10-CM | POA: Diagnosis not present

## 2018-07-04 DIAGNOSIS — M1712 Unilateral primary osteoarthritis, left knee: Secondary | ICD-10-CM | POA: Diagnosis not present

## 2018-07-04 DIAGNOSIS — Z483 Aftercare following surgery for neoplasm: Secondary | ICD-10-CM | POA: Diagnosis not present

## 2018-07-05 DIAGNOSIS — M84452K Pathological fracture, left femur, subsequent encounter for fracture with nonunion: Secondary | ICD-10-CM | POA: Diagnosis not present

## 2018-07-05 DIAGNOSIS — I119 Hypertensive heart disease without heart failure: Secondary | ICD-10-CM | POA: Diagnosis not present

## 2018-07-05 DIAGNOSIS — F419 Anxiety disorder, unspecified: Secondary | ICD-10-CM | POA: Diagnosis not present

## 2018-07-05 DIAGNOSIS — Z90710 Acquired absence of both cervix and uterus: Secondary | ICD-10-CM | POA: Diagnosis not present

## 2018-07-05 DIAGNOSIS — E7800 Pure hypercholesterolemia, unspecified: Secondary | ICD-10-CM | POA: Diagnosis not present

## 2018-07-05 DIAGNOSIS — F329 Major depressive disorder, single episode, unspecified: Secondary | ICD-10-CM | POA: Diagnosis not present

## 2018-07-10 DIAGNOSIS — F419 Anxiety disorder, unspecified: Secondary | ICD-10-CM | POA: Diagnosis not present

## 2018-07-10 DIAGNOSIS — E7800 Pure hypercholesterolemia, unspecified: Secondary | ICD-10-CM | POA: Diagnosis not present

## 2018-07-10 DIAGNOSIS — Z90710 Acquired absence of both cervix and uterus: Secondary | ICD-10-CM | POA: Diagnosis not present

## 2018-07-10 DIAGNOSIS — F329 Major depressive disorder, single episode, unspecified: Secondary | ICD-10-CM | POA: Diagnosis not present

## 2018-07-10 DIAGNOSIS — M84452K Pathological fracture, left femur, subsequent encounter for fracture with nonunion: Secondary | ICD-10-CM | POA: Diagnosis not present

## 2018-07-10 DIAGNOSIS — I119 Hypertensive heart disease without heart failure: Secondary | ICD-10-CM | POA: Diagnosis not present

## 2018-07-12 DIAGNOSIS — E7800 Pure hypercholesterolemia, unspecified: Secondary | ICD-10-CM | POA: Diagnosis not present

## 2018-07-12 DIAGNOSIS — Z90710 Acquired absence of both cervix and uterus: Secondary | ICD-10-CM | POA: Diagnosis not present

## 2018-07-12 DIAGNOSIS — F329 Major depressive disorder, single episode, unspecified: Secondary | ICD-10-CM | POA: Diagnosis not present

## 2018-07-12 DIAGNOSIS — F419 Anxiety disorder, unspecified: Secondary | ICD-10-CM | POA: Diagnosis not present

## 2018-07-12 DIAGNOSIS — I119 Hypertensive heart disease without heart failure: Secondary | ICD-10-CM | POA: Diagnosis not present

## 2018-07-12 DIAGNOSIS — M84452K Pathological fracture, left femur, subsequent encounter for fracture with nonunion: Secondary | ICD-10-CM | POA: Diagnosis not present

## 2018-07-17 DIAGNOSIS — I119 Hypertensive heart disease without heart failure: Secondary | ICD-10-CM | POA: Diagnosis not present

## 2018-07-17 DIAGNOSIS — F329 Major depressive disorder, single episode, unspecified: Secondary | ICD-10-CM | POA: Diagnosis not present

## 2018-07-17 DIAGNOSIS — M84452K Pathological fracture, left femur, subsequent encounter for fracture with nonunion: Secondary | ICD-10-CM | POA: Diagnosis not present

## 2018-07-17 DIAGNOSIS — E7800 Pure hypercholesterolemia, unspecified: Secondary | ICD-10-CM | POA: Diagnosis not present

## 2018-07-17 DIAGNOSIS — Z90710 Acquired absence of both cervix and uterus: Secondary | ICD-10-CM | POA: Diagnosis not present

## 2018-07-17 DIAGNOSIS — F419 Anxiety disorder, unspecified: Secondary | ICD-10-CM | POA: Diagnosis not present

## 2018-07-19 DIAGNOSIS — M84452K Pathological fracture, left femur, subsequent encounter for fracture with nonunion: Secondary | ICD-10-CM | POA: Diagnosis not present

## 2018-07-19 DIAGNOSIS — I119 Hypertensive heart disease without heart failure: Secondary | ICD-10-CM | POA: Diagnosis not present

## 2018-07-19 DIAGNOSIS — F419 Anxiety disorder, unspecified: Secondary | ICD-10-CM | POA: Diagnosis not present

## 2018-07-19 DIAGNOSIS — Z90710 Acquired absence of both cervix and uterus: Secondary | ICD-10-CM | POA: Diagnosis not present

## 2018-07-19 DIAGNOSIS — F329 Major depressive disorder, single episode, unspecified: Secondary | ICD-10-CM | POA: Diagnosis not present

## 2018-07-19 DIAGNOSIS — E7800 Pure hypercholesterolemia, unspecified: Secondary | ICD-10-CM | POA: Diagnosis not present

## 2018-07-28 DIAGNOSIS — Z6825 Body mass index (BMI) 25.0-25.9, adult: Secondary | ICD-10-CM | POA: Diagnosis not present

## 2018-07-28 DIAGNOSIS — M792 Neuralgia and neuritis, unspecified: Secondary | ICD-10-CM | POA: Diagnosis not present

## 2018-07-28 DIAGNOSIS — E663 Overweight: Secondary | ICD-10-CM | POA: Diagnosis not present

## 2018-07-28 DIAGNOSIS — M150 Primary generalized (osteo)arthritis: Secondary | ICD-10-CM | POA: Diagnosis not present

## 2018-07-31 DIAGNOSIS — Z4789 Encounter for other orthopedic aftercare: Secondary | ICD-10-CM | POA: Diagnosis not present

## 2018-07-31 DIAGNOSIS — M84452K Pathological fracture, left femur, subsequent encounter for fracture with nonunion: Secondary | ICD-10-CM | POA: Diagnosis not present

## 2018-07-31 DIAGNOSIS — M898X5 Other specified disorders of bone, thigh: Secondary | ICD-10-CM | POA: Diagnosis not present

## 2018-08-02 DIAGNOSIS — Z23 Encounter for immunization: Secondary | ICD-10-CM | POA: Diagnosis not present

## 2018-09-11 DIAGNOSIS — I10 Essential (primary) hypertension: Secondary | ICD-10-CM | POA: Diagnosis not present

## 2018-09-11 DIAGNOSIS — E7849 Other hyperlipidemia: Secondary | ICD-10-CM | POA: Diagnosis not present

## 2018-09-11 DIAGNOSIS — E785 Hyperlipidemia, unspecified: Secondary | ICD-10-CM | POA: Diagnosis not present

## 2018-09-11 DIAGNOSIS — Z8249 Family history of ischemic heart disease and other diseases of the circulatory system: Secondary | ICD-10-CM | POA: Diagnosis not present

## 2018-09-11 DIAGNOSIS — R82998 Other abnormal findings in urine: Secondary | ICD-10-CM | POA: Diagnosis not present

## 2018-09-18 DIAGNOSIS — Z1389 Encounter for screening for other disorder: Secondary | ICD-10-CM | POA: Diagnosis not present

## 2018-09-18 DIAGNOSIS — M5090 Cervical disc disorder, unspecified, unspecified cervical region: Secondary | ICD-10-CM | POA: Diagnosis not present

## 2018-09-18 DIAGNOSIS — Z6825 Body mass index (BMI) 25.0-25.9, adult: Secondary | ICD-10-CM | POA: Diagnosis not present

## 2018-09-18 DIAGNOSIS — N951 Menopausal and female climacteric states: Secondary | ICD-10-CM | POA: Diagnosis not present

## 2018-09-18 DIAGNOSIS — E7849 Other hyperlipidemia: Secondary | ICD-10-CM | POA: Diagnosis not present

## 2018-09-18 DIAGNOSIS — Z0000 Encounter for general adult medical examination without abnormal findings: Secondary | ICD-10-CM | POA: Diagnosis not present

## 2018-09-18 DIAGNOSIS — Z23 Encounter for immunization: Secondary | ICD-10-CM | POA: Diagnosis not present

## 2018-09-18 DIAGNOSIS — C499 Malignant neoplasm of connective and soft tissue, unspecified: Secondary | ICD-10-CM | POA: Diagnosis not present

## 2018-09-18 DIAGNOSIS — M1990 Unspecified osteoarthritis, unspecified site: Secondary | ICD-10-CM | POA: Diagnosis not present

## 2018-09-18 DIAGNOSIS — Z8249 Family history of ischemic heart disease and other diseases of the circulatory system: Secondary | ICD-10-CM | POA: Diagnosis not present

## 2018-09-18 DIAGNOSIS — I10 Essential (primary) hypertension: Secondary | ICD-10-CM | POA: Diagnosis not present

## 2018-09-18 DIAGNOSIS — I8310 Varicose veins of unspecified lower extremity with inflammation: Secondary | ICD-10-CM | POA: Diagnosis not present

## 2018-09-18 DIAGNOSIS — J328 Other chronic sinusitis: Secondary | ICD-10-CM | POA: Diagnosis not present

## 2018-09-21 DIAGNOSIS — Z1212 Encounter for screening for malignant neoplasm of rectum: Secondary | ICD-10-CM | POA: Diagnosis not present

## 2018-09-29 DIAGNOSIS — M8589 Other specified disorders of bone density and structure, multiple sites: Secondary | ICD-10-CM | POA: Diagnosis not present

## 2018-09-29 DIAGNOSIS — M859 Disorder of bone density and structure, unspecified: Secondary | ICD-10-CM | POA: Diagnosis not present

## 2018-10-03 DIAGNOSIS — M84552K Pathological fracture in neoplastic disease, left femur, subsequent encounter for fracture with nonunion: Secondary | ICD-10-CM | POA: Diagnosis not present

## 2018-10-03 DIAGNOSIS — M85852 Other specified disorders of bone density and structure, left thigh: Secondary | ICD-10-CM | POA: Diagnosis not present

## 2018-10-03 DIAGNOSIS — M84452K Pathological fracture, left femur, subsequent encounter for fracture with nonunion: Secondary | ICD-10-CM | POA: Diagnosis not present

## 2018-10-03 DIAGNOSIS — M8580 Other specified disorders of bone density and structure, unspecified site: Secondary | ICD-10-CM | POA: Diagnosis not present

## 2018-10-03 DIAGNOSIS — Z483 Aftercare following surgery for neoplasm: Secondary | ICD-10-CM | POA: Diagnosis not present

## 2018-10-03 DIAGNOSIS — C4922 Malignant neoplasm of connective and soft tissue of left lower limb, including hip: Secondary | ICD-10-CM | POA: Diagnosis not present

## 2018-10-25 DIAGNOSIS — N3001 Acute cystitis with hematuria: Secondary | ICD-10-CM | POA: Diagnosis not present

## 2018-10-27 DIAGNOSIS — E663 Overweight: Secondary | ICD-10-CM | POA: Diagnosis not present

## 2018-10-27 DIAGNOSIS — Z6825 Body mass index (BMI) 25.0-25.9, adult: Secondary | ICD-10-CM | POA: Diagnosis not present

## 2018-10-27 DIAGNOSIS — M792 Neuralgia and neuritis, unspecified: Secondary | ICD-10-CM | POA: Diagnosis not present

## 2018-10-27 DIAGNOSIS — M150 Primary generalized (osteo)arthritis: Secondary | ICD-10-CM | POA: Diagnosis not present

## 2018-11-20 DIAGNOSIS — Z91040 Latex allergy status: Secondary | ICD-10-CM | POA: Diagnosis not present

## 2018-11-20 DIAGNOSIS — S72302D Unspecified fracture of shaft of left femur, subsequent encounter for closed fracture with routine healing: Secondary | ICD-10-CM | POA: Diagnosis not present

## 2018-11-20 DIAGNOSIS — Z882 Allergy status to sulfonamides status: Secondary | ICD-10-CM | POA: Diagnosis not present

## 2018-11-20 DIAGNOSIS — S72392D Other fracture of shaft of left femur, subsequent encounter for closed fracture with routine healing: Secondary | ICD-10-CM | POA: Diagnosis not present

## 2018-11-20 DIAGNOSIS — T84298A Other mechanical complication of internal fixation device of other bones, initial encounter: Secondary | ICD-10-CM | POA: Diagnosis not present

## 2018-11-20 DIAGNOSIS — M85852 Other specified disorders of bone density and structure, left thigh: Secondary | ICD-10-CM | POA: Diagnosis not present

## 2018-11-20 DIAGNOSIS — M85862 Other specified disorders of bone density and structure, left lower leg: Secondary | ICD-10-CM | POA: Diagnosis not present

## 2018-11-20 DIAGNOSIS — T85848D Pain due to other internal prosthetic devices, implants and grafts, subsequent encounter: Secondary | ICD-10-CM | POA: Diagnosis not present

## 2018-11-20 DIAGNOSIS — M84552K Pathological fracture in neoplastic disease, left femur, subsequent encounter for fracture with nonunion: Secondary | ICD-10-CM | POA: Diagnosis not present

## 2018-11-20 DIAGNOSIS — M1712 Unilateral primary osteoarthritis, left knee: Secondary | ICD-10-CM | POA: Diagnosis not present

## 2018-11-20 DIAGNOSIS — Z4789 Encounter for other orthopedic aftercare: Secondary | ICD-10-CM | POA: Diagnosis not present

## 2018-11-20 DIAGNOSIS — T85848A Pain due to other internal prosthetic devices, implants and grafts, initial encounter: Secondary | ICD-10-CM | POA: Diagnosis not present

## 2018-11-23 DIAGNOSIS — M84452K Pathological fracture, left femur, subsequent encounter for fracture with nonunion: Secondary | ICD-10-CM | POA: Diagnosis not present

## 2018-11-23 DIAGNOSIS — T85618A Breakdown (mechanical) of other specified internal prosthetic devices, implants and grafts, initial encounter: Secondary | ICD-10-CM | POA: Diagnosis not present

## 2018-11-23 DIAGNOSIS — T85848D Pain due to other internal prosthetic devices, implants and grafts, subsequent encounter: Secondary | ICD-10-CM | POA: Diagnosis not present

## 2018-11-23 DIAGNOSIS — T85848A Pain due to other internal prosthetic devices, implants and grafts, initial encounter: Secondary | ICD-10-CM | POA: Diagnosis not present

## 2018-11-23 DIAGNOSIS — Z472 Encounter for removal of internal fixation device: Secondary | ICD-10-CM | POA: Diagnosis not present

## 2018-12-19 DIAGNOSIS — Z4802 Encounter for removal of sutures: Secondary | ICD-10-CM | POA: Diagnosis not present

## 2019-01-09 DIAGNOSIS — M84552K Pathological fracture in neoplastic disease, left femur, subsequent encounter for fracture with nonunion: Secondary | ICD-10-CM | POA: Diagnosis not present

## 2019-01-09 DIAGNOSIS — M84552D Pathological fracture in neoplastic disease, left femur, subsequent encounter for fracture with routine healing: Secondary | ICD-10-CM | POA: Diagnosis not present

## 2019-01-09 DIAGNOSIS — M8588 Other specified disorders of bone density and structure, other site: Secondary | ICD-10-CM | POA: Diagnosis not present

## 2019-01-09 DIAGNOSIS — Z483 Aftercare following surgery for neoplasm: Secondary | ICD-10-CM | POA: Diagnosis not present

## 2019-01-09 DIAGNOSIS — K5730 Diverticulosis of large intestine without perforation or abscess without bleeding: Secondary | ICD-10-CM | POA: Diagnosis not present

## 2019-01-09 DIAGNOSIS — Z90710 Acquired absence of both cervix and uterus: Secondary | ICD-10-CM | POA: Diagnosis not present

## 2019-01-09 DIAGNOSIS — C7652 Malignant neoplasm of left lower limb: Secondary | ICD-10-CM | POA: Diagnosis not present

## 2019-02-08 DIAGNOSIS — S80869A Insect bite (nonvenomous), unspecified lower leg, initial encounter: Secondary | ICD-10-CM | POA: Diagnosis not present

## 2019-02-16 DIAGNOSIS — Z01419 Encounter for gynecological examination (general) (routine) without abnormal findings: Secondary | ICD-10-CM | POA: Diagnosis not present

## 2019-02-16 DIAGNOSIS — Z1231 Encounter for screening mammogram for malignant neoplasm of breast: Secondary | ICD-10-CM | POA: Diagnosis not present

## 2019-02-16 DIAGNOSIS — M545 Low back pain: Secondary | ICD-10-CM | POA: Diagnosis not present

## 2019-02-23 DIAGNOSIS — M792 Neuralgia and neuritis, unspecified: Secondary | ICD-10-CM | POA: Diagnosis not present

## 2019-02-23 DIAGNOSIS — Z6826 Body mass index (BMI) 26.0-26.9, adult: Secondary | ICD-10-CM | POA: Diagnosis not present

## 2019-02-23 DIAGNOSIS — E663 Overweight: Secondary | ICD-10-CM | POA: Diagnosis not present

## 2019-02-23 DIAGNOSIS — M150 Primary generalized (osteo)arthritis: Secondary | ICD-10-CM | POA: Diagnosis not present

## 2019-03-28 DIAGNOSIS — H2513 Age-related nuclear cataract, bilateral: Secondary | ICD-10-CM | POA: Diagnosis not present

## 2019-04-04 DIAGNOSIS — S72302A Unspecified fracture of shaft of left femur, initial encounter for closed fracture: Secondary | ICD-10-CM | POA: Diagnosis not present

## 2019-04-04 DIAGNOSIS — S8392XA Sprain of unspecified site of left knee, initial encounter: Secondary | ICD-10-CM | POA: Diagnosis not present

## 2019-04-04 DIAGNOSIS — T85848D Pain due to other internal prosthetic devices, implants and grafts, subsequent encounter: Secondary | ICD-10-CM | POA: Diagnosis not present

## 2019-04-04 DIAGNOSIS — M25462 Effusion, left knee: Secondary | ICD-10-CM | POA: Diagnosis not present

## 2019-04-04 DIAGNOSIS — M25562 Pain in left knee: Secondary | ICD-10-CM | POA: Diagnosis not present

## 2019-04-25 DIAGNOSIS — M25462 Effusion, left knee: Secondary | ICD-10-CM | POA: Diagnosis not present

## 2019-04-25 DIAGNOSIS — M25562 Pain in left knee: Secondary | ICD-10-CM | POA: Diagnosis not present

## 2019-05-16 DIAGNOSIS — R300 Dysuria: Secondary | ICD-10-CM | POA: Diagnosis not present

## 2019-05-16 DIAGNOSIS — Z23 Encounter for immunization: Secondary | ICD-10-CM | POA: Diagnosis not present

## 2019-05-16 DIAGNOSIS — R350 Frequency of micturition: Secondary | ICD-10-CM | POA: Diagnosis not present

## 2019-05-22 DIAGNOSIS — M84552K Pathological fracture in neoplastic disease, left femur, subsequent encounter for fracture with nonunion: Secondary | ICD-10-CM | POA: Diagnosis not present

## 2019-05-22 DIAGNOSIS — Z483 Aftercare following surgery for neoplasm: Secondary | ICD-10-CM | POA: Diagnosis not present

## 2019-05-22 DIAGNOSIS — C7652 Malignant neoplasm of left lower limb: Secondary | ICD-10-CM | POA: Diagnosis not present

## 2019-05-22 DIAGNOSIS — M85852 Other specified disorders of bone density and structure, left thigh: Secondary | ICD-10-CM | POA: Diagnosis not present

## 2019-05-28 DIAGNOSIS — Z6826 Body mass index (BMI) 26.0-26.9, adult: Secondary | ICD-10-CM | POA: Diagnosis not present

## 2019-05-28 DIAGNOSIS — E663 Overweight: Secondary | ICD-10-CM | POA: Diagnosis not present

## 2019-05-28 DIAGNOSIS — M792 Neuralgia and neuritis, unspecified: Secondary | ICD-10-CM | POA: Diagnosis not present

## 2019-05-28 DIAGNOSIS — M150 Primary generalized (osteo)arthritis: Secondary | ICD-10-CM | POA: Diagnosis not present

## 2019-06-12 DIAGNOSIS — R300 Dysuria: Secondary | ICD-10-CM | POA: Diagnosis not present

## 2019-06-12 DIAGNOSIS — N3000 Acute cystitis without hematuria: Secondary | ICD-10-CM | POA: Diagnosis not present

## 2019-06-12 DIAGNOSIS — J3089 Other allergic rhinitis: Secondary | ICD-10-CM | POA: Diagnosis not present

## 2019-08-11 DIAGNOSIS — R350 Frequency of micturition: Secondary | ICD-10-CM | POA: Diagnosis not present

## 2019-08-28 DIAGNOSIS — M792 Neuralgia and neuritis, unspecified: Secondary | ICD-10-CM | POA: Diagnosis not present

## 2019-08-28 DIAGNOSIS — Z6826 Body mass index (BMI) 26.0-26.9, adult: Secondary | ICD-10-CM | POA: Diagnosis not present

## 2019-08-28 DIAGNOSIS — M150 Primary generalized (osteo)arthritis: Secondary | ICD-10-CM | POA: Diagnosis not present

## 2019-08-28 DIAGNOSIS — E663 Overweight: Secondary | ICD-10-CM | POA: Diagnosis not present

## 2019-09-18 DIAGNOSIS — C4922 Malignant neoplasm of connective and soft tissue of left lower limb, including hip: Secondary | ICD-10-CM | POA: Diagnosis not present

## 2019-09-18 DIAGNOSIS — M84552K Pathological fracture in neoplastic disease, left femur, subsequent encounter for fracture with nonunion: Secondary | ICD-10-CM | POA: Diagnosis not present

## 2019-09-18 DIAGNOSIS — M1612 Unilateral primary osteoarthritis, left hip: Secondary | ICD-10-CM | POA: Diagnosis not present

## 2019-09-18 DIAGNOSIS — Z4789 Encounter for other orthopedic aftercare: Secondary | ICD-10-CM | POA: Diagnosis not present

## 2019-09-18 DIAGNOSIS — Z483 Aftercare following surgery for neoplasm: Secondary | ICD-10-CM | POA: Diagnosis not present

## 2019-09-18 DIAGNOSIS — M8580 Other specified disorders of bone density and structure, unspecified site: Secondary | ICD-10-CM | POA: Diagnosis not present

## 2019-09-18 DIAGNOSIS — M47898 Other spondylosis, sacral and sacrococcygeal region: Secondary | ICD-10-CM | POA: Diagnosis not present

## 2019-09-28 DIAGNOSIS — J029 Acute pharyngitis, unspecified: Secondary | ICD-10-CM | POA: Diagnosis not present

## 2019-09-28 DIAGNOSIS — R05 Cough: Secondary | ICD-10-CM | POA: Diagnosis not present

## 2019-09-28 DIAGNOSIS — J329 Chronic sinusitis, unspecified: Secondary | ICD-10-CM | POA: Diagnosis not present

## 2019-10-01 DIAGNOSIS — E7849 Other hyperlipidemia: Secondary | ICD-10-CM | POA: Diagnosis not present

## 2019-10-05 DIAGNOSIS — I10 Essential (primary) hypertension: Secondary | ICD-10-CM | POA: Diagnosis not present

## 2019-10-05 DIAGNOSIS — R82998 Other abnormal findings in urine: Secondary | ICD-10-CM | POA: Diagnosis not present

## 2019-10-08 DIAGNOSIS — K219 Gastro-esophageal reflux disease without esophagitis: Secondary | ICD-10-CM | POA: Diagnosis not present

## 2019-10-08 DIAGNOSIS — I10 Essential (primary) hypertension: Secondary | ICD-10-CM | POA: Diagnosis not present

## 2019-10-08 DIAGNOSIS — E785 Hyperlipidemia, unspecified: Secondary | ICD-10-CM | POA: Diagnosis not present

## 2019-10-08 DIAGNOSIS — N951 Menopausal and female climacteric states: Secondary | ICD-10-CM | POA: Diagnosis not present

## 2019-10-08 DIAGNOSIS — Z8249 Family history of ischemic heart disease and other diseases of the circulatory system: Secondary | ICD-10-CM | POA: Diagnosis not present

## 2019-10-08 DIAGNOSIS — M84452S Pathological fracture, left femur, sequela: Secondary | ICD-10-CM | POA: Diagnosis not present

## 2019-10-08 DIAGNOSIS — Z1339 Encounter for screening examination for other mental health and behavioral disorders: Secondary | ICD-10-CM | POA: Diagnosis not present

## 2019-10-08 DIAGNOSIS — Z0000 Encounter for general adult medical examination without abnormal findings: Secondary | ICD-10-CM | POA: Diagnosis not present

## 2019-10-08 DIAGNOSIS — Z1331 Encounter for screening for depression: Secondary | ICD-10-CM | POA: Diagnosis not present

## 2019-10-08 DIAGNOSIS — K921 Melena: Secondary | ICD-10-CM | POA: Diagnosis not present

## 2019-10-12 DIAGNOSIS — M79662 Pain in left lower leg: Secondary | ICD-10-CM | POA: Diagnosis not present

## 2019-10-12 DIAGNOSIS — Z4789 Encounter for other orthopedic aftercare: Secondary | ICD-10-CM | POA: Diagnosis not present

## 2019-10-17 DIAGNOSIS — R195 Other fecal abnormalities: Secondary | ICD-10-CM | POA: Diagnosis not present

## 2019-11-02 DIAGNOSIS — N3001 Acute cystitis with hematuria: Secondary | ICD-10-CM | POA: Diagnosis not present

## 2019-11-02 DIAGNOSIS — R300 Dysuria: Secondary | ICD-10-CM | POA: Diagnosis not present

## 2019-11-02 DIAGNOSIS — J0140 Acute pansinusitis, unspecified: Secondary | ICD-10-CM | POA: Diagnosis not present

## 2019-11-05 DIAGNOSIS — Z1159 Encounter for screening for other viral diseases: Secondary | ICD-10-CM | POA: Diagnosis not present

## 2019-11-08 DIAGNOSIS — D125 Benign neoplasm of sigmoid colon: Secondary | ICD-10-CM | POA: Diagnosis not present

## 2019-11-08 DIAGNOSIS — R195 Other fecal abnormalities: Secondary | ICD-10-CM | POA: Diagnosis not present

## 2019-11-12 ENCOUNTER — Ambulatory Visit: Payer: Medicare Other | Attending: Internal Medicine

## 2019-11-12 DIAGNOSIS — Z23 Encounter for immunization: Secondary | ICD-10-CM | POA: Insufficient documentation

## 2019-11-12 NOTE — Progress Notes (Signed)
   Covid-19 Vaccination Clinic  Name:  MEILANI ROETHLISBERGER    MRN: [id number] DOB: October 14, 1950  11/12/2019  Ms. Forrey was observed post Covid-19 immunization for 15 minutes without incidence. She was provided with Vaccine Information Sheet and instruction to access the V-Safe system.   Ms. Margison was instructed to call 911 with any severe reactions post vaccine: Marland Kitchen Difficulty breathing  . Swelling of your face and throat  . A fast heartbeat  . A bad rash all over your body  . Dizziness and weakness    Immunizations Administered    Name Date Dose VIS Date Route   Pfizer COVID-19 Vaccine 11/12/2019 11:36 AM 0.3 mL 08/24/2019 Intramuscular   Manufacturer: [place]   Lot: [id number]   [place]: [id number]

## 2019-11-13 DIAGNOSIS — D125 Benign neoplasm of sigmoid colon: Secondary | ICD-10-CM | POA: Diagnosis not present

## 2019-11-16 DIAGNOSIS — E7849 Other hyperlipidemia: Secondary | ICD-10-CM | POA: Diagnosis not present

## 2019-11-21 DIAGNOSIS — M84452K Pathological fracture, left femur, subsequent encounter for fracture with nonunion: Secondary | ICD-10-CM | POA: Diagnosis not present

## 2019-11-21 DIAGNOSIS — M8588 Other specified disorders of bone density and structure, other site: Secondary | ICD-10-CM | POA: Diagnosis not present

## 2019-11-21 DIAGNOSIS — Z4789 Encounter for other orthopedic aftercare: Secondary | ICD-10-CM | POA: Diagnosis not present

## 2019-11-21 DIAGNOSIS — M25462 Effusion, left knee: Secondary | ICD-10-CM | POA: Diagnosis not present

## 2019-11-21 DIAGNOSIS — M1711 Unilateral primary osteoarthritis, right knee: Secondary | ICD-10-CM | POA: Diagnosis not present

## 2019-11-21 DIAGNOSIS — M25561 Pain in right knee: Secondary | ICD-10-CM | POA: Diagnosis not present

## 2019-11-28 DIAGNOSIS — E663 Overweight: Secondary | ICD-10-CM | POA: Diagnosis not present

## 2019-11-28 DIAGNOSIS — M792 Neuralgia and neuritis, unspecified: Secondary | ICD-10-CM | POA: Diagnosis not present

## 2019-11-28 DIAGNOSIS — M150 Primary generalized (osteo)arthritis: Secondary | ICD-10-CM | POA: Diagnosis not present

## 2019-12-06 DIAGNOSIS — R300 Dysuria: Secondary | ICD-10-CM | POA: Diagnosis not present

## 2019-12-06 DIAGNOSIS — N390 Urinary tract infection, site not specified: Secondary | ICD-10-CM | POA: Diagnosis not present

## 2019-12-11 ENCOUNTER — Ambulatory Visit: Payer: Medicare Other | Attending: Internal Medicine

## 2019-12-11 DIAGNOSIS — Z23 Encounter for immunization: Secondary | ICD-10-CM

## 2019-12-11 NOTE — Progress Notes (Signed)
   Covid-19 Vaccination Clinic  Name:  Veronica Mcclain    MRN: [id number] DOB: [birth year]  12/11/2019  [name] was observed post Covid-19 immunization for 15 minutes without incident. She was provided with Vaccine Information Sheet and instruction to access the V-Safe system.   [name] was instructed to call 911 with any severe reactions post vaccine: Marland Kitchen Difficulty breathing  . Swelling of face and throat  . A fast heartbeat  . A bad rash all over body  . Dizziness and weakness   Immunizations Administered    Name Date Dose VIS Date Route   Pfizer COVID-19 Vaccine 12/11/2019 10:47 AM 0.3 mL 08/24/2019 Intramuscular   Manufacturer: [place]   Lot: [id number]   [place]: [id number]

## 2019-12-17 DIAGNOSIS — M1711 Unilateral primary osteoarthritis, right knee: Secondary | ICD-10-CM | POA: Diagnosis not present

## 2019-12-17 DIAGNOSIS — C4922 Malignant neoplasm of connective and soft tissue of left lower limb, including hip: Secondary | ICD-10-CM | POA: Diagnosis not present

## 2019-12-17 DIAGNOSIS — Z4789 Encounter for other orthopedic aftercare: Secondary | ICD-10-CM | POA: Diagnosis not present

## 2019-12-27 ENCOUNTER — Ambulatory Visit (INDEPENDENT_AMBULATORY_CARE_PROVIDER_SITE_OTHER): Payer: Medicare Other | Admitting: Dermatology

## 2019-12-27 ENCOUNTER — Encounter: Payer: Self-pay | Admitting: Dermatology

## 2019-12-27 ENCOUNTER — Other Ambulatory Visit: Payer: Self-pay

## 2019-12-27 DIAGNOSIS — L309 Dermatitis, unspecified: Secondary | ICD-10-CM

## 2019-12-27 MED ORDER — FLUOCINOLONE ACETONIDE 0.025 % EX CREA: TOPICAL_CREAM | Freq: Two times a day (BID) | CUTANEOUS | 0 refills | Status: AC

## 2019-12-27 NOTE — Patient Instructions (Addendum)
Sample of Synalar cream 0.025% given to patient; Lot - [id number] and Exp - 01/2020. Veronica Mcclain returns today with a 4-day history of an intensely itchy spot on her right forearm.  It did occur after being outdoors.  Examination showed a very swollen 7 mm pink draining spot.  This best fits a local allergy, more likely bite than contact dermatitis.  My #1 suspect would be a ground mite (trigger).  I explained that there is no evaluation indicated.  We gave Veronica Mcclain a small tube of Synalar cream and I advised her to apply this twice daily and cover it for 20 to 30 minutes with a moist cool cloth for at least 2 to 3 days.  She may get over-the-counter [place] antiitch and use it as much as she wants for the first several days while she still itching.  I have asked her to please call the office on Monday to let me know she is seeing improvement.

## 2019-12-31 ENCOUNTER — Encounter: Payer: Self-pay | Admitting: Dermatology

## 2019-12-31 NOTE — Progress Notes (Signed)
   Follow-Up Visit   Subjective  Aran Renier Paulus is a 69 y.o. female who presents for the following: Skin Problem (Patient here today for spot on right lower forearm x 4 days, patient states that spot is itching and draining.  Patient has been using Mupirocin ointment and calamine lotion both aren't helping.).  rash Location: right forearm Duration: Several days Quality: Constant Associated Signs/Symptoms: Intense itch Modifying Factors: No known bite or contact exposure Severity:  Timing: Context:   The following portions of the chart were reviewed this encounter and updated as appropriate:     Objective  Well appearing patient in no apparent distress; mood and affect are within normal limits.  A focused examination was performed including hands, arms. Relevant physical exam findings are noted in the Assessment and Plan. Sample of Synalar cream 0.025% given to patient; Lot - [id number] and Exp - 01/2020. Simona Huh returns today with a 4-day history of an intensely itchy spot on her right forearm.  It did occur after being outdoors.  Examination showed a very swollen 7 mm pink draining spot.  This best fits a local allergy, more likely bite than contact dermatitis.  My #1 suspect would be a ground mite (trigger).  I explained that there is no evaluation indicated.  We gave Trinati a small tube of Synalar cream and I advised her to apply this twice daily and cover it for 20 to 30 minutes with a moist cool cloth for at least 2 to 3 days.  She may get over-the-counter [place] antiitch and use it as much as she wants for the first several days while she still itching.  I have asked her to please call the office on Monday to let me know she is seeing improvement.  Assessment & Plan  Dermatitis Right Forearm - Posterior  Samples Synalar cream, apply b.I.d. for five days and call us.

## 2020-02-06 DIAGNOSIS — N952 Postmenopausal atrophic vaginitis: Secondary | ICD-10-CM | POA: Diagnosis not present

## 2020-03-13 DIAGNOSIS — M84452K Pathological fracture, left femur, subsequent encounter for fracture with nonunion: Secondary | ICD-10-CM | POA: Diagnosis not present

## 2020-03-13 DIAGNOSIS — T85848A Pain due to other internal prosthetic devices, implants and grafts, initial encounter: Secondary | ICD-10-CM | POA: Diagnosis not present

## 2020-03-13 DIAGNOSIS — T84115A Breakdown (mechanical) of internal fixation device of left femur, initial encounter: Secondary | ICD-10-CM | POA: Diagnosis not present

## 2020-04-01 DIAGNOSIS — Z4802 Encounter for removal of sutures: Secondary | ICD-10-CM | POA: Diagnosis not present

## 2020-04-22 DIAGNOSIS — R300 Dysuria: Secondary | ICD-10-CM | POA: Diagnosis not present

## 2020-05-06 DIAGNOSIS — N951 Menopausal and female climacteric states: Secondary | ICD-10-CM | POA: Diagnosis not present

## 2020-05-07 DIAGNOSIS — H2513 Age-related nuclear cataract, bilateral: Secondary | ICD-10-CM | POA: Diagnosis not present

## 2020-05-07 DIAGNOSIS — H5213 Myopia, bilateral: Secondary | ICD-10-CM | POA: Diagnosis not present

## 2020-05-08 DIAGNOSIS — Z87440 Personal history of urinary (tract) infections: Secondary | ICD-10-CM | POA: Diagnosis not present

## 2020-05-08 DIAGNOSIS — N952 Postmenopausal atrophic vaginitis: Secondary | ICD-10-CM | POA: Diagnosis not present

## 2020-06-03 DIAGNOSIS — H02831 Dermatochalasis of right upper eyelid: Secondary | ICD-10-CM | POA: Diagnosis not present

## 2020-06-03 DIAGNOSIS — H02535 Eyelid retraction left lower eyelid: Secondary | ICD-10-CM | POA: Diagnosis not present

## 2020-06-03 DIAGNOSIS — H53483 Generalized contraction of visual field, bilateral: Secondary | ICD-10-CM | POA: Diagnosis not present

## 2020-06-03 DIAGNOSIS — H57813 Brow ptosis, bilateral: Secondary | ICD-10-CM | POA: Diagnosis not present

## 2020-06-03 DIAGNOSIS — H02135 Senile ectropion of left lower eyelid: Secondary | ICD-10-CM | POA: Diagnosis not present

## 2020-06-03 DIAGNOSIS — H0279 Other degenerative disorders of eyelid and periocular area: Secondary | ICD-10-CM | POA: Diagnosis not present

## 2020-06-03 DIAGNOSIS — H02834 Dermatochalasis of left upper eyelid: Secondary | ICD-10-CM | POA: Diagnosis not present

## 2020-06-03 DIAGNOSIS — H02532 Eyelid retraction right lower eyelid: Secondary | ICD-10-CM | POA: Diagnosis not present

## 2020-06-03 DIAGNOSIS — H02132 Senile ectropion of right lower eyelid: Secondary | ICD-10-CM | POA: Diagnosis not present

## 2020-06-03 DIAGNOSIS — H02413 Mechanical ptosis of bilateral eyelids: Secondary | ICD-10-CM | POA: Diagnosis not present

## 2020-06-03 DIAGNOSIS — H02423 Myogenic ptosis of bilateral eyelids: Secondary | ICD-10-CM | POA: Diagnosis not present

## 2020-06-05 DIAGNOSIS — Z6827 Body mass index (BMI) 27.0-27.9, adult: Secondary | ICD-10-CM | POA: Diagnosis not present

## 2020-06-05 DIAGNOSIS — M792 Neuralgia and neuritis, unspecified: Secondary | ICD-10-CM | POA: Diagnosis not present

## 2020-06-05 DIAGNOSIS — M150 Primary generalized (osteo)arthritis: Secondary | ICD-10-CM | POA: Diagnosis not present

## 2020-06-05 DIAGNOSIS — E663 Overweight: Secondary | ICD-10-CM | POA: Diagnosis not present

## 2020-06-09 DIAGNOSIS — M79652 Pain in left thigh: Secondary | ICD-10-CM | POA: Diagnosis not present

## 2020-06-09 DIAGNOSIS — Z4789 Encounter for other orthopedic aftercare: Secondary | ICD-10-CM | POA: Diagnosis not present

## 2020-06-09 DIAGNOSIS — S72392D Other fracture of shaft of left femur, subsequent encounter for closed fracture with routine healing: Secondary | ICD-10-CM | POA: Diagnosis not present

## 2020-06-09 DIAGNOSIS — Z9889 Other specified postprocedural states: Secondary | ICD-10-CM | POA: Diagnosis not present

## 2020-06-10 DIAGNOSIS — H53483 Generalized contraction of visual field, bilateral: Secondary | ICD-10-CM | POA: Diagnosis not present

## 2020-06-10 DIAGNOSIS — H53481 Generalized contraction of visual field, right eye: Secondary | ICD-10-CM | POA: Diagnosis not present

## 2020-06-10 DIAGNOSIS — H53482 Generalized contraction of visual field, left eye: Secondary | ICD-10-CM | POA: Diagnosis not present

## 2020-07-17 DIAGNOSIS — Z23 Encounter for immunization: Secondary | ICD-10-CM | POA: Diagnosis not present

## 2020-07-28 DIAGNOSIS — M25562 Pain in left knee: Secondary | ICD-10-CM | POA: Diagnosis not present

## 2020-07-28 DIAGNOSIS — M1712 Unilateral primary osteoarthritis, left knee: Secondary | ICD-10-CM | POA: Diagnosis not present

## 2020-08-26 DIAGNOSIS — R3915 Urgency of urination: Secondary | ICD-10-CM | POA: Diagnosis not present

## 2020-08-26 DIAGNOSIS — R350 Frequency of micturition: Secondary | ICD-10-CM | POA: Diagnosis not present

## 2020-08-26 DIAGNOSIS — R3129 Other microscopic hematuria: Secondary | ICD-10-CM | POA: Diagnosis not present

## 2020-09-04 DIAGNOSIS — Z6826 Body mass index (BMI) 26.0-26.9, adult: Secondary | ICD-10-CM | POA: Diagnosis not present

## 2020-09-04 DIAGNOSIS — M792 Neuralgia and neuritis, unspecified: Secondary | ICD-10-CM | POA: Diagnosis not present

## 2020-09-04 DIAGNOSIS — E663 Overweight: Secondary | ICD-10-CM | POA: Diagnosis not present

## 2020-09-04 DIAGNOSIS — M150 Primary generalized (osteo)arthritis: Secondary | ICD-10-CM | POA: Diagnosis not present

## 2020-09-10 DIAGNOSIS — Z23 Encounter for immunization: Secondary | ICD-10-CM | POA: Diagnosis not present

## 2020-10-10 DIAGNOSIS — E785 Hyperlipidemia, unspecified: Secondary | ICD-10-CM | POA: Diagnosis not present

## 2020-10-10 DIAGNOSIS — I10 Essential (primary) hypertension: Secondary | ICD-10-CM | POA: Diagnosis not present

## 2020-10-17 DIAGNOSIS — G4700 Insomnia, unspecified: Secondary | ICD-10-CM | POA: Diagnosis not present

## 2020-10-17 DIAGNOSIS — I10 Essential (primary) hypertension: Secondary | ICD-10-CM | POA: Diagnosis not present

## 2020-10-17 DIAGNOSIS — Z1331 Encounter for screening for depression: Secondary | ICD-10-CM | POA: Diagnosis not present

## 2020-10-17 DIAGNOSIS — Z1339 Encounter for screening examination for other mental health and behavioral disorders: Secondary | ICD-10-CM | POA: Diagnosis not present

## 2020-10-17 DIAGNOSIS — Z0000 Encounter for general adult medical examination without abnormal findings: Secondary | ICD-10-CM | POA: Diagnosis not present

## 2020-10-17 DIAGNOSIS — E785 Hyperlipidemia, unspecified: Secondary | ICD-10-CM | POA: Diagnosis not present

## 2020-10-17 DIAGNOSIS — R82998 Other abnormal findings in urine: Secondary | ICD-10-CM | POA: Diagnosis not present

## 2020-10-17 DIAGNOSIS — K219 Gastro-esophageal reflux disease without esophagitis: Secondary | ICD-10-CM | POA: Diagnosis not present

## 2020-10-17 DIAGNOSIS — Z1212 Encounter for screening for malignant neoplasm of rectum: Secondary | ICD-10-CM | POA: Diagnosis not present

## 2020-12-03 DIAGNOSIS — E663 Overweight: Secondary | ICD-10-CM | POA: Diagnosis not present

## 2020-12-03 DIAGNOSIS — M150 Primary generalized (osteo)arthritis: Secondary | ICD-10-CM | POA: Diagnosis not present

## 2020-12-03 DIAGNOSIS — M792 Neuralgia and neuritis, unspecified: Secondary | ICD-10-CM | POA: Diagnosis not present

## 2020-12-03 DIAGNOSIS — Z6827 Body mass index (BMI) 27.0-27.9, adult: Secondary | ICD-10-CM | POA: Diagnosis not present

## 2020-12-16 DIAGNOSIS — Z87891 Personal history of nicotine dependence: Secondary | ICD-10-CM | POA: Diagnosis not present

## 2020-12-16 DIAGNOSIS — C4922 Malignant neoplasm of connective and soft tissue of left lower limb, including hip: Secondary | ICD-10-CM | POA: Diagnosis not present

## 2020-12-16 DIAGNOSIS — Z483 Aftercare following surgery for neoplasm: Secondary | ICD-10-CM | POA: Diagnosis not present

## 2020-12-16 DIAGNOSIS — Z08 Encounter for follow-up examination after completed treatment for malignant neoplasm: Secondary | ICD-10-CM | POA: Diagnosis not present

## 2020-12-16 DIAGNOSIS — Z85831 Personal history of malignant neoplasm of soft tissue: Secondary | ICD-10-CM | POA: Diagnosis not present

## 2020-12-16 DIAGNOSIS — M84552K Pathological fracture in neoplastic disease, left femur, subsequent encounter for fracture with nonunion: Secondary | ICD-10-CM | POA: Diagnosis not present

## 2022-08-05 ENCOUNTER — Emergency Department (HOSPITAL_COMMUNITY): Payer: Medicare Other

## 2022-08-05 ENCOUNTER — Other Ambulatory Visit (HOSPITAL_COMMUNITY): Payer: Self-pay

## 2022-08-05 ENCOUNTER — Encounter (HOSPITAL_COMMUNITY): Payer: Self-pay | Admitting: Emergency Medicine

## 2022-08-05 ENCOUNTER — Other Ambulatory Visit: Payer: Self-pay

## 2022-08-05 ENCOUNTER — Emergency Department (HOSPITAL_COMMUNITY)
Admission: EM | Admit: 2022-08-05 | Discharge: 2022-08-05 | Disposition: A | Discharge status: Other Acute Inpatient Hospital | Payer: Medicare Other | Attending: Emergency Medicine | Admitting: Emergency Medicine

## 2022-08-05 DIAGNOSIS — S73005A Unspecified dislocation of left hip, initial encounter: Secondary | ICD-10-CM | POA: Diagnosis not present

## 2022-08-05 DIAGNOSIS — X509XXA Other and unspecified overexertion or strenuous movements or postures, initial encounter: Secondary | ICD-10-CM | POA: Insufficient documentation

## 2022-08-05 DIAGNOSIS — Z91040 Latex allergy status: Secondary | ICD-10-CM | POA: Diagnosis not present

## 2022-08-05 DIAGNOSIS — Z7982 Long term (current) use of aspirin: Secondary | ICD-10-CM | POA: Insufficient documentation

## 2022-08-05 DIAGNOSIS — Z85830 Personal history of malignant neoplasm of bone: Secondary | ICD-10-CM | POA: Diagnosis not present

## 2022-08-05 DIAGNOSIS — Z96642 Presence of left artificial hip joint: Secondary | ICD-10-CM | POA: Insufficient documentation

## 2022-08-05 DIAGNOSIS — S79912A Unspecified injury of left hip, initial encounter: Secondary | ICD-10-CM | POA: Diagnosis present

## 2022-08-05 LAB — CBC WITH DIFFERENTIAL/PLATELET
Abs Immature Granulocytes: 0.01 10*3/uL (ref 0.00–0.07)
Basophils Absolute: 0 10*3/uL (ref 0.0–0.1)
Basophils Relative: 0 %
Eosinophils Absolute: 0.2 10*3/uL (ref 0.0–0.5)
Eosinophils Relative: 5 %
HCT: 39.8 % (ref 36.0–46.0)
Hemoglobin: 12.1 g/dL (ref 12.0–15.0)
Immature Granulocytes: 0 %
Lymphocytes Relative: 30 %
Lymphs Abs: 1.4 10*3/uL (ref 0.7–4.0)
MCH: 27.6 pg (ref 26.0–34.0)
MCHC: 30.4 g/dL (ref 30.0–36.0)
MCV: 90.9 fL (ref 80.0–100.0)
Monocytes Absolute: 0.5 10*3/uL (ref 0.1–1.0)
Monocytes Relative: 9 %
Neutro Abs: 2.7 10*3/uL (ref 1.7–7.7)
Neutrophils Relative %: 56 %
Platelets: 304 10*3/uL (ref 150–400)
RBC: 4.38 MIL/uL (ref 3.87–5.11)
RDW: 14 % (ref 11.5–15.5)
WBC: 4.9 10*3/uL (ref 4.0–10.5)
nRBC: 0 % (ref 0.0–0.2)

## 2022-08-05 LAB — BASIC METABOLIC PANEL
Anion gap: 4 — ABNORMAL LOW (ref 5–15)
BUN: 11 mg/dL (ref 8–23)
CO2: 28 mmol/L (ref 22–32)
Calcium: 8.8 mg/dL — ABNORMAL LOW (ref 8.9–10.3)
Chloride: 106 mmol/L (ref 98–111)
Creatinine, Ser: 0.73 mg/dL (ref 0.44–1.00)
GFR, Estimated: >60 mL/min (ref >60)
Glucose, Bld: 101 mg/dL — ABNORMAL HIGH (ref 70–99)
Potassium: 3.4 mmol/L — ABNORMAL LOW (ref 3.5–5.1)
Sodium: 138 mmol/L (ref 135–145)

## 2022-08-05 MED ORDER — HYDROMORPHONE HCL 1 MG/ML IJ SOLN
1.0000 mg | Freq: Once | INTRAMUSCULAR | Status: AC
  Administered 2022-08-05: 1 mg via INTRAVENOUS
  Filled 2022-08-05: qty 1

## 2022-08-05 MED ORDER — HYDROMORPHONE HCL 1 MG/ML IJ SOLN
0.5000 mg | Freq: Once | INTRAMUSCULAR | Status: AC
  Administered 2022-08-05: 0.5 mg via INTRAVENOUS
  Filled 2022-08-05: qty 0.5

## 2022-08-05 NOTE — ED Provider Notes (Signed)
[facility] Provider Note   CSN: [id number] Arrival date & time: 08/05/22  1049     History  Chief Complaint  Patient presents with   Hip Pain    Veronica Mcclain is a 71 y.o. female.  Patient has a history of recent hip surgery for left hip replacement.  She had a history of sarcoma with radiation treatment and had a femur fracture at that point but the first surgery did not work so she had a different surgery 7 weeks ago.  Patient bent over and has pain in her left hip  The history is provided by the patient. No language interpreter was used.  Hip Pain This is a new problem. The current episode started 6 to 12 hours ago. The problem occurs constantly. The problem has not changed since onset.Pertinent negatives include no chest pain, no abdominal pain and no headaches. Nothing aggravates the symptoms. Nothing relieves the symptoms. She has tried nothing for the symptoms. The treatment provided no relief.       Home Medications Prior to Admission medications   Medication Sig Start Date End Date Taking? Authorizing Provider  ascorbic acid (VITAMIN C) 500 MG tablet Vitamin C 500 mg tablet  TAKE 1 TABLET BY MOUTH 3 TIMES DAILY WITH MEALS. 05/22/18   [provider]  aspirin EC 325 MG tablet Take 325 mg by mouth every other day. 01/03/17   [provider]  atorvastatin (LIPITOR) 80 MG tablet TAKE 1 TABLET BY MOUTH EVERY DAY 10/24/14   [provider]  B Complex Vitamins (VITAMIN B COMPLEX PO) Take by mouth.    [provider]  calcium-vitamin D (OSCAL WITH D) 500-200 MG-UNIT TABS tablet Take by mouth.    [provider]  Cholecalciferol 25 MCG (1000 UT) tablet Vitamin D3 25 mcg (1,000 unit) tablet  TAKE 1 TABLET BY MOUTH DAILY.    [provider]  cyclobenzaprine (FLEXERIL) 5 MG tablet Take by mouth. 05/09/18   [provider]  diclofenac (VOLTAREN) 75 MG EC tablet diclofenac sodium 75 mg tablet,delayed  release  TAKE 1 TABLET BY MOUTH EVERY DAY    [provider]  fluconazole (DIFLUCAN) 100 MG tablet fluconazole 100 mg tablet  TAKE 1 TABLET ON THE 1ST, 5TH, AND 10TH DAY    [provider]  fluocinolone (SYNALAR) 0.025 % cream Apply topically 2 (two) times daily. 12/27/19   Lavonna Monarch, MD  fluticasone (FLONASE) 50 MCG/ACT nasal spray 2 sprays in each nostril daily x1 week then 1-2 sprays in each nostril daily. (use smallest dose possible for symptom control after week 1). 06/12/19 06/11/20  [provider]  gabapentin (NEURONTIN) 300 MG capsule Take by mouth. 07/23/19   [provider]  HYDROcodone-acetaminophen (NORCO) 10-325 MG tablet Take by mouth. 01/02/19   [provider]  ibuprofen (ADVIL) 400 MG tablet Take by mouth.    [provider]  ondansetron (ZOFRAN-ODT) 8 MG disintegrating tablet Take 8 mg by mouth every 8 (eight) hours as needed. 07/19/16   [provider]  pantoprazole (PROTONIX) 40 MG tablet Take by mouth. 09/12/15   [provider]  potassium chloride SA (KLOR-CON M20) 20 MEQ tablet Take by mouth. 07/10/15   [provider]  Turmeric 400 MG CAPS Take by mouth.    [provider]  venlafaxine XR (EFFEXOR-XR) 75 MG 24 hr capsule venlafaxine ER 75 mg capsule,extended release 24 hr  TAKE 1 CAPSULE BY MOUTH EVERY DAY    [provider]      Allergies    Sulfa antibiotics and Latex    Review of Systems   Review of Systems  Constitutional:  Negative for appetite change and fatigue.  HENT:  Negative for congestion, ear discharge and sinus pressure.   Eyes:  Negative for discharge.  Respiratory:  Negative for cough.   Cardiovascular:  Negative for chest pain.  Gastrointestinal:  Negative for abdominal pain and diarrhea.  Genitourinary:  Negative for frequency and hematuria.  Musculoskeletal:  Negative for back pain.       Left hip pain  Skin:  Negative for rash.  Neurological:   Negative for seizures and headaches.  Psychiatric/Behavioral:  Negative for hallucinations.     Physical Exam Updated Vital Signs BP 139/73   Pulse 85   Temp 98.1 F (36.7 C) (Oral)   Resp 18   SpO2 95%  Physical Exam Vitals and nursing note reviewed.  Constitutional:      Appearance: She is well-developed.  HENT:     Head: Normocephalic.     Nose: Nose normal.  Eyes:     General: No scleral icterus.    Conjunctiva/sclera: Conjunctivae normal.  Neck:     Thyroid: No thyromegaly.  Cardiovascular:     Rate and Rhythm: Normal rate and regular rhythm.     Heart sounds: No murmur heard.    No friction rub. No gallop.  Pulmonary:     Breath sounds: No stridor. No wheezing or rales.  Chest:     Chest wall: No tenderness.  Abdominal:     General: There is no distension.     Tenderness: There is no abdominal tenderness. There is no rebound.  Musculoskeletal:        General: Normal range of motion.     Cervical back: Neck supple.     Comments: Tenderness to left hip  Lymphadenopathy:     Cervical: No cervical adenopathy.  Skin:    Findings: No erythema or rash.  Neurological:     Mental Status: She is alert and oriented to person, place, and time.     Motor: No abnormal muscle tone.     Coordination: Coordination normal.  Psychiatric:        Behavior: Behavior normal.     ED Results / Procedures / Treatments   Labs (all labs ordered are listed, but only abnormal results are displayed) Labs Reviewed  BASIC METABOLIC PANEL - Abnormal; Notable for the following components:      Result Value   Potassium 3.4 (*)    Glucose, Bld 101 (*)    Calcium 8.8 (*)    Anion gap 4 (*)    All other components within normal limits  CBC WITH DIFFERENTIAL/PLATELET    EKG None  Radiology DG Hip Unilat W or Wo Pelvis 2-3 Views Left  Result Date: 08/05/2022 CLINICAL DATA:  Pain. History of left femur fracture following sarcoma resection and postoperative radiation the did not  heal. Status post proximal femoral replacement. EXAM: DG HIP (WITH OR WITHOUT PELVIS) 2-3V LEFT COMPARISON:  03/02/2022 FINDINGS: Interval left hip replacement. Femoral component of the prosthesis is superiorly dislocated. No evidence for an associated fracture although the entire femoral component has not been visualized. Bones are diffusely demineralized. SI joints and symphysis pubis unremarkable. IMPRESSION: Superior dislocation of the femoral component of the left hip prosthesis. The entire femoral prosthesis has not been visualized. Electronically Signed   By: Misty Stanley M.D.   On: 08/05/2022 12:55  Procedures Procedures    Medications Ordered in ED Medications  HYDROmorphone (DILAUDID) injection 0.5 mg (0.5 mg Intravenous Given 08/05/22 1154)  HYDROmorphone (DILAUDID) injection 0.5 mg (0.5 mg Intravenous Given 08/05/22 1252)  HYDROmorphone (DILAUDID) injection 1 mg (1 mg Intravenous Given 08/05/22 1424)    ED Course/ Medical Decision Making/ A&P Clinical Course as of 08/05/22 1508  Thu Aug 05, 2022  1503 Transfer to [facility] [MK] Teressa Lower, MD                           Medical Decision Making Amount and/or Complexity of Data Reviewed Labs: ordered. Radiology: ordered.  Risk Prescription drug management.  This patient presents to the ED for concern of hip pain, this involves an extensive number of treatment options, and is a complaint that carries with it a high risk of complications and morbidity.  The differential diagnosis includes acute hip, hip dislocation   Co morbidities that complicate the patient evaluation  Treatment for sarcoma and that left leg   Additional history obtained:  Additional history obtained from family member External records from outside source obtained and reviewed including hospital records   Lab Tests:  I Ordered, and personally interpreted labs.  The pertinent results include: CBC and  chemistries unremarkable   Imaging Studies ordered:  I ordered imaging studies including left hip I independently visualized and interpreted imaging which showed dislocated left hip I agree with the radiologist interpretation   Cardiac Monitoring: / EKG:  The patient was maintained on a cardiac monitor.  I personally viewed and interpreted the cardiac monitored which showed an underlying rhythm of: Normal sinus rhythm   Consultations Obtained:  I requested consultation with the orthopedics,  and discussed lab and imaging findings as well as pertinent plan - they recommend: Transfer to [facility] for treatment of dislocated hip   Problem List / ED Course / Critical interventions / Medication management  Dislocated hip and history of sarcoma I ordered medication including Dilaudid for pain Reevaluation of the patient after these medicines showed that the patient improved I have reviewed the patients home medicines and have made adjustments as needed   Social Determinants of Health:  None   Test / Admission - Considered:  None  Patient has had dislocation of her left hip.  It is anteriorly displaced.  I spoke with Dr. Prudy Feeler at [facility] and he accepted the patient in transfer for her dislocated left hip        Final Clinical Impression(s) / ED Diagnoses Final diagnoses:  Dislocation of left hip, initial encounter [facility])    Rx / [place] Orders ED Discharge Orders     None         Milton Ferguson, MD 08/07/22 [phone]

## 2022-08-05 NOTE — ED Triage Notes (Signed)
Pt c/o left hip pain since bending down to get something today.pt had left hip surgery 6 weeks ago. Pt arrived via ems a/o. Pt c/o pain to left posterior lateral inner thigh. Pedal pulses present. Unable to see if definite deformity due to pt having to lie on right side some.

## 2022-08-05 NOTE — Discharge Instructions (Signed)
Transfer patient to [facility] emergency department.  Dr. Prudy Feeler orthopedic surgeon accepting

## 2022-08-06 NOTE — ED Provider Notes (Signed)
  Physical Exam  BP (!) 111/46 (BP Location: Right Arm)   Pulse 96   Temp 98.5 F (36.9 C) (Oral)   Resp 20   SpO2 100%   Physical Exam Vitals and nursing note reviewed.  Constitutional:      General: She is not in acute distress.    Appearance: She is well-developed.  HENT:     Head: Normocephalic and atraumatic.  Eyes:     Conjunctiva/sclera: Conjunctivae normal.  Cardiovascular:     Rate and Rhythm: Normal rate and regular rhythm.     Heart sounds: No murmur heard. Pulmonary:     Effort: Pulmonary effort is normal. No respiratory distress.  Musculoskeletal:        General: No swelling.     Cervical back: Neck supple.  Skin:    General: Skin is warm and dry.     Capillary Refill: Capillary refill takes less than 2 seconds.  Neurological:     Mental Status: She is alert.  Psychiatric:        Mood and Affect: Mood normal.     Procedures  Procedures  ED Course / MDM   Clinical Course as of 08/06/22 1328  Thu Aug 05, 2022  1503 Transfer to [facility] [MK]    Clinical Course User Index [MK] Shaleta Ruacho, Debe Coder, MD   Medical Decision Making Amount and/or Complexity of Data Reviewed Labs: ordered. Radiology: ordered.  Risk Prescription drug management.   Patient received in handoff.  The hip dislocation pending transfer to [facility].  Patient then transferred to [place], [place], MD 08/06/22 1329

## 2023-04-02 ENCOUNTER — Emergency Department (HOSPITAL_COMMUNITY)
Admission: EM | Admit: 2023-04-02 | Discharge: 2023-04-03 | Disposition: A | Discharge status: Other Acute Inpatient Hospital | Payer: Medicare Other | Attending: Student | Admitting: Student

## 2023-04-02 ENCOUNTER — Emergency Department (HOSPITAL_COMMUNITY): Payer: Medicare Other

## 2023-04-02 ENCOUNTER — Other Ambulatory Visit: Payer: Self-pay

## 2023-04-02 ENCOUNTER — Encounter (HOSPITAL_COMMUNITY): Payer: Self-pay

## 2023-04-02 DIAGNOSIS — T8149XA Infection following a procedure, other surgical site, initial encounter: Secondary | ICD-10-CM | POA: Insufficient documentation

## 2023-04-02 DIAGNOSIS — A419 Sepsis, unspecified organism: Secondary | ICD-10-CM | POA: Insufficient documentation

## 2023-04-02 DIAGNOSIS — I119 Hypertensive heart disease without heart failure: Secondary | ICD-10-CM | POA: Insufficient documentation

## 2023-04-02 DIAGNOSIS — Z91040 Latex allergy status: Secondary | ICD-10-CM | POA: Diagnosis not present

## 2023-04-02 DIAGNOSIS — M25552 Pain in left hip: Secondary | ICD-10-CM | POA: Insufficient documentation

## 2023-04-02 DIAGNOSIS — Z7982 Long term (current) use of aspirin: Secondary | ICD-10-CM | POA: Diagnosis not present

## 2023-04-02 DIAGNOSIS — Z87891 Personal history of nicotine dependence: Secondary | ICD-10-CM | POA: Insufficient documentation

## 2023-04-02 DIAGNOSIS — T8140XA Infection following a procedure, unspecified, initial encounter: Secondary | ICD-10-CM

## 2023-04-02 DIAGNOSIS — Z85831 Personal history of malignant neoplasm of soft tissue: Secondary | ICD-10-CM | POA: Insufficient documentation

## 2023-04-02 LAB — COMPREHENSIVE METABOLIC PANEL
ALT: 11 U/L (ref 0–44)
AST: 19 U/L (ref 15–41)
Albumin: 3.3 g/dL — ABNORMAL LOW (ref 3.5–5.0)
Alkaline Phosphatase: 86 U/L (ref 38–126)
Anion gap: 9 (ref 5–15)
BUN: 12 mg/dL (ref 8–23)
CO2: 26 mmol/L (ref 22–32)
Calcium: 8.5 mg/dL — ABNORMAL LOW (ref 8.9–10.3)
Chloride: 99 mmol/L (ref 98–111)
Creatinine, Ser: 0.74 mg/dL (ref 0.44–1.00)
GFR, Estimated: >60 mL/min (ref >60)
Glucose, Bld: 116 mg/dL — ABNORMAL HIGH (ref 70–99)
Potassium: 3.5 mmol/L (ref 3.5–5.1)
Sodium: 134 mmol/L — ABNORMAL LOW (ref 135–145)
Total Bilirubin: 0.4 mg/dL (ref 0.3–1.2)
Total Protein: 7 g/dL (ref 6.5–8.1)

## 2023-04-02 LAB — CULTURE, BLOOD (ROUTINE X 2)

## 2023-04-02 LAB — CBC WITH DIFFERENTIAL/PLATELET
Abs Immature Granulocytes: 0.04 10*3/uL (ref 0.00–0.07)
Basophils Absolute: 0 10*3/uL (ref 0.0–0.1)
Basophils Relative: 0 %
Eosinophils Absolute: 0 10*3/uL (ref 0.0–0.5)
Eosinophils Relative: 0 %
HCT: 35.7 % — ABNORMAL LOW (ref 36.0–46.0)
Hemoglobin: 11.2 g/dL — ABNORMAL LOW (ref 12.0–15.0)
Immature Granulocytes: 0 %
Lymphocytes Relative: 5 %
Lymphs Abs: 0.5 10*3/uL — ABNORMAL LOW (ref 0.7–4.0)
MCH: 27.5 pg (ref 26.0–34.0)
MCHC: 31.4 g/dL (ref 30.0–36.0)
MCV: 87.7 fL (ref 80.0–100.0)
Monocytes Absolute: 0.7 10*3/uL (ref 0.1–1.0)
Monocytes Relative: 6 %
Neutro Abs: 10.2 10*3/uL — ABNORMAL HIGH (ref 1.7–7.7)
Neutrophils Relative %: 89 %
Platelets: 439 10*3/uL — ABNORMAL HIGH (ref 150–400)
RBC: 4.07 MIL/uL (ref 3.87–5.11)
RDW: 14.5 % (ref 11.5–15.5)
WBC: 11.5 10*3/uL — ABNORMAL HIGH (ref 4.0–10.5)
nRBC: 0 % (ref 0.0–0.2)

## 2023-04-02 LAB — SEDIMENTATION RATE: Sed Rate: 27 mm/hr — ABNORMAL HIGH (ref 0–22)

## 2023-04-02 LAB — LACTIC ACID, PLASMA
Lactic Acid, Venous: 2.4 mmol/L (ref 0.5–1.9)
Lactic Acid, Venous: 3.9 mmol/L (ref 0.5–1.9)
Lactic Acid, Venous: 2.9 mmol/L (ref 0.5–1.9)

## 2023-04-02 MED ORDER — IOHEXOL 300 MG/ML  SOLN
100.0000 mL | Freq: Once | INTRAMUSCULAR | Status: AC | PRN
  Administered 2023-04-02: 100 mL via INTRAVENOUS

## 2023-04-02 MED ORDER — LACTATED RINGERS IV BOLUS
1000.0000 mL | Freq: Once | INTRAVENOUS | Status: AC
  Administered 2023-04-02: 1000 mL via INTRAVENOUS

## 2023-04-02 MED ORDER — MORPHINE SULFATE (PF) 4 MG/ML IV SOLN
4.0000 mg | Freq: Once | INTRAVENOUS | Status: AC
  Administered 2023-04-02: 4 mg via INTRAVENOUS
  Filled 2023-04-02: qty 1

## 2023-04-02 MED ORDER — VANCOMYCIN HCL 2000 MG/400ML IV SOLN: 2000.0000 mg | INTRAVENOUS | Status: DC

## 2023-04-02 MED ORDER — KETOROLAC TROMETHAMINE 15 MG/ML IJ SOLN
15.0000 mg | Freq: Once | INTRAMUSCULAR | Status: AC
  Administered 2023-04-02: 15 mg via INTRAVENOUS
  Filled 2023-04-02: qty 1

## 2023-04-02 MED ORDER — VANCOMYCIN HCL IN DEXTROSE 1-5 GM/200ML-% IV SOLN
1000.0000 mg | Freq: Once | INTRAVENOUS | Status: AC
  Administered 2023-04-02: 1000 mg via INTRAVENOUS
  Filled 2023-04-02: qty 200

## 2023-04-02 MED ORDER — VANCOMYCIN HCL IN DEXTROSE 1-5 GM/200ML-% IV SOLN
1000.0000 mg | Freq: Once | INTRAVENOUS | Status: DC
  Filled 2023-04-02: qty 200

## 2023-04-02 MED ORDER — VANCOMYCIN HCL IN DEXTROSE 1-5 GM/200ML-% IV SOLN
1000.0000 mg | Freq: Once | INTRAVENOUS | Status: AC
  Administered 2023-04-02: 1000 mg via INTRAVENOUS

## 2023-04-02 MED ORDER — SODIUM CHLORIDE 0.9 % IV SOLN
2.0000 g | Freq: Once | INTRAVENOUS | Status: AC
  Administered 2023-04-02: 2 g via INTRAVENOUS
  Filled 2023-04-02: qty 12.5

## 2023-04-02 MED ORDER — HYDROMORPHONE HCL 1 MG/ML IJ SOLN
1.0000 mg | Freq: Once | INTRAMUSCULAR | Status: AC
  Administered 2023-04-02: 1 mg via INTRAVENOUS
  Filled 2023-04-02: qty 1

## 2023-04-02 MED ORDER — ACETAMINOPHEN 500 MG PO TABS
1000.0000 mg | ORAL_TABLET | Freq: Once | ORAL | Status: AC
  Administered 2023-04-02: 1000 mg via ORAL
  Filled 2023-04-02: qty 2

## 2023-04-02 MED ORDER — MORPHINE SULFATE (PF) 4 MG/ML IV SOLN
4.0000 mg | INTRAVENOUS | Status: DC | PRN
  Administered 2023-04-03: 4 mg via INTRAVENOUS
  Filled 2023-04-02: qty 1

## 2023-04-02 MED ORDER — FENTANYL CITRATE PF 50 MCG/ML IJ SOSY
50.0000 ug | PREFILLED_SYRINGE | Freq: Once | INTRAMUSCULAR | Status: AC
  Administered 2023-04-02: 50 ug via INTRAVENOUS
  Filled 2023-04-02: qty 1

## 2023-04-02 MED ORDER — ONDANSETRON HCL 4 MG/2ML IJ SOLN
4.0000 mg | Freq: Once | INTRAMUSCULAR | Status: AC
  Administered 2023-04-02: 4 mg via INTRAVENOUS
  Filled 2023-04-02: qty 2

## 2023-04-02 NOTE — ED Notes (Signed)
Lab at bedside- difficult getting blood cultures

## 2023-04-02 NOTE — ED Notes (Signed)
Patient transported to X-ray 

## 2023-04-02 NOTE — ED Provider Notes (Addendum)
[place] EMERGENCY DEPARTMENT AT [facility] Provider Note  CSN: [id number] Arrival date & time: 04/02/23 1615  Chief Complaint(s) Hip Pain  HPI Veronica Mcclain is a 72 y.o. 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  Patient arrives confused, A&O to self only.  Arrives febrile and tachycardic.  Pain reportedly 10 out of 10 this morning but did receive home oxycodone prior to arrival and now pain is 5 out of 10.  Pain primarily in the left hip near the surgical site.  Additional history unable to be obtained given patient's altered mental status.   Past Medical History Past Medical History:  Diagnosis Date   Cancer (HCC)    GERD (gastroesophageal reflux disease)    Hyperlipidemia    Patient Active Problem List   Diagnosis Date Noted   Pleomorphic cell sarcoma (HCC)    Abdominal pain 01/21/2012   Benign hypertensive heart disease without heart failure 04/23/2011   Flank pain 12/04/2010   Hyperlipidemia    GERD (gastroesophageal reflux disease)    Home Medication(s) Prior to Admission medications   Medication Sig Start Date End Date Taking? Authorizing Provider  cyclobenzaprine (FLEXERIL) 5 MG tablet Take 5 mg by mouth 3 (three) times daily. 05/09/18  Yes [provider]  enoxaparin (LOVENOX) 40 MG/0.4ML injection Inject 40 mg into the skin daily.   Yes [provider]  meloxicam (MOBIC) 15 MG tablet Take 15 mg by mouth daily.   Yes [provider]  oxyCODONE-acetaminophen (PERCOCET) 10-325 MG tablet Take 1 tablet by mouth every 6 (six) hours as needed for pain.   Yes [provider]  ascorbic acid (VITAMIN C) 500 MG tablet Take 500 mg by mouth 3 (three) times daily.  05/22/18   [provider]  aspirin EC 325 MG tablet Take 325 mg by mouth every other day. 01/03/17   [provider]  atorvastatin (LIPITOR) 80 MG tablet Take 80 mg by mouth daily. 10/24/14   [provider]  B Complex Vitamins (VITAMIN B COMPLEX PO) Take 1 tablet by mouth daily.    [provider]  calcium-vitamin D (OSCAL WITH D) 500-200 MG-UNIT TABS tablet Take 1 tablet by mouth daily.    [provider]  Cholecalciferol 25 MCG (1000 UT) tablet Vitamin D3 25 mcg (1,000 unit) tablet  TAKE 1 TABLET BY MOUTH DAILY.    [provider]  diclofenac (VOLTAREN) 75 MG EC tablet diclofenac sodium 75 mg tablet,delayed release  TAKE 1 TABLET BY MOUTH EVERY DAY    [provider]  fluconazole (DIFLUCAN) 100 MG tablet fluconazole 100 mg tablet  TAKE 1 TABLET ON THE 1ST, 5TH, AND 10TH DAY    [provider]  fluocinolone (SYNALAR) 0.025 % cream Apply topically 2 (two) times daily. 12/27/19   Janalyn Harder, MD  gabapentin (NEURONTIN) 300 MG capsule Take by mouth. 07/23/19   [provider]  HYDROcodone-acetaminophen (NORCO) 10-325 MG tablet Take by mouth. 01/02/19   [provider]  ibuprofen (ADVIL) 400 MG tablet Take by mouth.    [provider]  ondansetron (ZOFRAN-ODT) 8 MG disintegrating tablet Take 8 mg by mouth every 8 (eight) hours as needed. 07/19/16   [provider]  pantoprazole (PROTONIX) 40 MG tablet Take by mouth. 09/12/15  [provider]  potassium chloride SA (KLOR-CON M20) 20 MEQ tablet Take by mouth. 07/10/15   [provider]  Turmeric 400 MG CAPS Take by mouth.    [provider]  venlafaxine XR (EFFEXOR-XR) 75 MG 24 hr capsule venlafaxine ER 75 mg capsule,extended release 24 hr  TAKE 1 CAPSULE BY MOUTH EVERY DAY    [provider]                                                                                                                                     Past Surgical History Past Surgical History:  Procedure Laterality Date   ABDOMINAL HYSTERECTOMY     APPENDECTOMY     BACK SURGERY     BREAST SURGERY     CESAREAN SECTION     FOOT SURGERY     Family History Family History  Problem Relation Age of Onset   Coronary artery disease Father    Coronary artery disease Mother    Pancreatic cancer Mother     Social History Social History   Tobacco Use   Smoking status: Former    Current packs/day: 0.75    Average packs/day: 0.8 packs/day for 10.0 years (7.5 ttl pk-yrs)    Types: Cigarettes   Smokeless tobacco: Never  Substance Use Topics   Alcohol use: No   Drug use: No   Allergies Sulfa antibiotics and Latex  Review of Systems Review of Systems  Unable to perform ROS: Mental status change    Physical Exam Vital Signs  I have reviewed the triage vital signs BP 131/66   Pulse (!) 107   Temp (!) 100.4 F (38 C) (Oral)   Resp 16   Ht 5\' 9"  (1.753 m)   Wt 90.7 kg   SpO2 96%   BMI 29.53 kg/m   Physical Exam Vitals and nursing note reviewed.  Constitutional:      Appearance: She is well-developed. She is ill-appearing.  HENT:     Head: Normocephalic and atraumatic.  Eyes:     Conjunctiva/sclera: Conjunctivae normal.  Cardiovascular:     Rate and Rhythm: Regular rhythm. Tachycardia present.     Heart sounds: No murmur heard. Pulmonary:     Effort: Pulmonary effort is normal. No respiratory distress.  Musculoskeletal:        General: Swelling and tenderness present.     Cervical back: Neck supple.  Skin:    General: Skin is warm and dry.     Capillary Refill: Capillary refill takes less than 2 seconds.  Neurological:     Mental Status: She is alert.  Psychiatric:        Mood and Affect: Mood normal.     ED Results and Treatments Labs (all labs ordered are listed, but only abnormal results are displayed) Labs Reviewed  LACTIC ACID, PLASMA - Abnormal; Notable for the following components:       Result  Value   Lactic Acid, Venous 2.4 (*)    All other components within normal limits  LACTIC ACID, PLASMA - Abnormal; Notable for the following components:   Lactic Acid, Venous 3.9 (*)    All other components within normal limits  COMPREHENSIVE METABOLIC PANEL - Abnormal; Notable for the following components:   Sodium 134 (*)    Glucose, Bld 116 (*)    Calcium 8.5 (*)    Albumin 3.3 (*)    All other components within normal limits  CBC WITH DIFFERENTIAL/PLATELET - Abnormal; Notable for the following components:   WBC 11.5 (*)    Hemoglobin 11.2 (*)    HCT 35.7 (*)    Platelets 439 (*)    Neutro Abs 10.2 (*)    Lymphs Abs 0.5 (*)    All other components within normal limits  SEDIMENTATION RATE - Abnormal; Notable for the following components:   Sed Rate 27 (*)    All other components within normal limits  CULTURE, BLOOD (ROUTINE X 2)  CULTURE, BLOOD (ROUTINE X 2)  C-REACTIVE PROTEIN  LACTIC ACID, PLASMA  LACTIC ACID, PLASMA                                                                                                                          Radiology CT HIP LEFT W CONTRAST  Result Date: 04/02/2023 CLINICAL DATA:  Osteomyelitis suspected, hip, xray done concern for post op infection EXAM: CT OF THE LOWER LEFT EXTREMITY WITH CONTRAST TECHNIQUE: Multidetector CT imaging of the lower left extremity was performed according to the standard protocol following intravenous contrast administration. RADIATION DOSE REDUCTION: This exam was performed according to the departmental dose-optimization program which includes automated exposure control, adjustment of the mA and/or kV according to patient size and/or use of iterative reconstruction technique. CONTRAST:  OMNIPAQUE IOHEXOL 300 MG/ML  SOLN COMPARISON:  Plain films today FINDINGS: Bones/Joint/Cartilage Left hip replacement. No visible evidence of loosening or osteomyelitis. No acute bony abnormality. Ligaments Suboptimally  assessed by CT. Muscles and Tendons Fluid collection noted extending into the left gluteal muscles described below. Soft tissues Large fluid collection within the subcutaneous soft tissues laterally extending a craniocaudal length of 17 cm and measuring 8.2 x 5.6 cm on image 273 of series 4. In addition to this direct visual fluid collection, there is fluid noted surrounding the femoral component of the hip replacement and extending along the length of the femoral component. This fluid collection also extends into the gluteal muscle. IMPRESSION: Large fluid collection surrounding the left femoral hip replacement extending from the femoral neck area down the length of the femoral component. This also extends into the left gluteal muscles superiorly. There is also a superficial fluid collection within the subcutaneous soft tissues laterally. No bone destruction to suggest osteomyelitis. Electronically Signed   By: Charlett Nose M.D.   On: 04/02/2023 20:11   DG Chest Portable 1 View  Result Date: 04/02/2023 CLINICAL DATA:  Evaluate for pneumonia  EXAM: PORTABLE CHEST 1 VIEW COMPARISON:  None Available. FINDINGS: The heart size and mediastinal contours are within normal limits. 5 mm nodular density seen in the left lower lung. There is also small focal opacity in the right lower lung, possibly airspace disease or atelectasis. There is no pleural effusion or pneumothorax. The visualized skeletal structures are unremarkable. IMPRESSION: 1. Small focal opacity in the right lower lung, possibly airspace disease or atelectasis. 2. 5 mm nodular density in the left lower lung. 3. Recommend follow-up chest x-ray in 4-6 weeks to re-evaluate versus follow-up chest CT. Electronically Signed   By: Darliss Cheney M.D.   On: 04/02/2023 19:38   DG Hip Unilat W or Wo Pelvis 2-3 Views Left  Result Date: 04/02/2023 CLINICAL DATA:  Fever, postoperative hip replacement EXAM: DG HIP (WITH OR WITHOUT PELVIS) 2-3V LEFT COMPARISON:  None  Available. FINDINGS: Status post left hip total arthroplasty. No perihardware fracture or component malpositioning. No displaced fracture of the pelvis or proximal right femur. Nonobstructive pattern of overlying bowel gas. IMPRESSION: Status post left hip total arthroplasty. No perihardware fracture or component malpositioning. No displaced fracture of the pelvis or proximal right femur. Electronically Signed   By: Jearld Lesch M.D.   On: 04/02/2023 18:37    Pertinent labs & imaging results that were available during my care of the patient were reviewed by me and considered in my medical decision making (see MDM for details).  Medications Ordered in ED Medications  vancomycin (VANCOREADY) IVPB 2000 mg/400 mL (has no administration in time range)  morphine (PF) 4 MG/ML injection 4 mg (has no administration in time range)  HYDROmorphone (DILAUDID) injection 1 mg (has no administration in time range)  ceFEPIme (MAXIPIME) 2 g in sodium chloride 0.9 % 100 mL IVPB (0 g Intravenous Stopped 04/02/23 1749)  vancomycin (VANCOCIN) IVPB 1000 mg/200 mL premix (0 mg Intravenous Stopped 04/02/23 2122)    And  vancomycin (VANCOCIN) IVPB 1000 mg/200 mL premix (0 mg Intravenous Stopped 04/02/23 1957)  iohexol (OMNIPAQUE) 300 MG/ML solution 100 mL (100 mLs Intravenous Contrast Given 04/02/23 1935)  lactated ringers bolus 1,000 mL (0 mLs Intravenous Stopped 04/02/23 2126)  morphine (PF) 4 MG/ML injection 4 mg (4 mg Intravenous Given 04/02/23 2023)  ondansetron (ZOFRAN) injection 4 mg (4 mg Intravenous Given 04/02/23 2023)  fentaNYL (SUBLIMAZE) injection 50 mcg (50 mcg Intravenous Given 04/02/23 2045)  lactated ringers bolus 1,000 mL (1,000 mLs Intravenous New Bag/Given 04/02/23 2151)  acetaminophen (TYLENOL) tablet 1,000 mg (1,000 mg Oral Given 04/02/23 2136)                                                                                                                                     Procedures .Critical  Care  Performed by: Glendora Score, MD Authorized by: Glendora Score, MD   Critical care provider statement:    Critical care time (minutes):  75   Critical care was  necessary to treat or prevent imminent or life-threatening deterioration of the following conditions:  Sepsis   Critical care was time spent personally by me on the following activities:  Development of treatment plan with patient or surrogate, discussions with consultants, evaluation of patient's response to treatment, examination of patient, ordering and review of laboratory studies, ordering and review of radiographic studies, ordering and performing treatments and interventions, pulse oximetry, re-evaluation of patient's condition and review of old charts   (including critical care time)  Medical Decision Making / ED Course   This patient presents to the ED for concern of fever, leg pain, this involves an extensive number of treatment options, and is a complaint that carries with it a high risk of complications and morbidity.  The differential diagnosis includes postoperative infection, abscess, pneumonia, PE, bacteremia, sepsis  MDM: Patient seen emergency room for evaluation of leg pain and fever.  Physical exam reveals an ill-appearing tachycardic patient with tenderness and swelling near the incision site.  Patient arrives febrile and tachycardic meeting sepsis criteria and broad-spectrum antibiotics initiated immediately.  Laboratory evaluation with leukocytosis to 11.5, hemoglobin 11.2, sed rate 27, initial lactic 2.4 and despite fluid resuscitation, lactic rising to 3.9.  Tylenol given for fever.  X-ray imaging with a small focal opacity in the lung atelectasis versus consolidation.  X-ray imaging of the hip is unremarkable but CT of the hip showing a large fluid collection seroma versus abscess.  I did consult the orthopedic surgeon at [facility] Dr. Dorris Carnes and after a lively and respectful discussion on a  recorded line, the patient was ultimately accepted for transfer to [facility] for orthopedic evaluation of their postoperative patient.    Additional history obtained: -Additional history obtained from husband -External records from outside source obtained and reviewed including: Chart review including previous notes, labs, imaging, consultation notes   Lab Tests: -I ordered, reviewed, and interpreted labs.   The pertinent results include:   Labs Reviewed  LACTIC ACID, PLASMA - Abnormal; Notable for the following components:      Result Value   Lactic Acid, Venous 2.4 (*)    All other components within normal limits  LACTIC ACID, PLASMA - Abnormal; Notable for the following components:   Lactic Acid, Venous 3.9 (*)    All other components within normal limits  COMPREHENSIVE METABOLIC PANEL - Abnormal; Notable for the following components:   Sodium 134 (*)    Glucose, Bld 116 (*)    Calcium 8.5 (*)    Albumin 3.3 (*)    All other components within normal limits  CBC WITH DIFFERENTIAL/PLATELET - Abnormal; Notable for the following components:   WBC 11.5 (*)    Hemoglobin 11.2 (*)    HCT 35.7 (*)    Platelets 439 (*)    Neutro Abs 10.2 (*)    Lymphs Abs 0.5 (*)    All other components within normal limits  SEDIMENTATION RATE - Abnormal; Notable for the following components:   Sed Rate 27 (*)    All other components within normal limits  CULTURE, BLOOD (ROUTINE X 2)  CULTURE, BLOOD (ROUTINE X 2)  C-REACTIVE PROTEIN  LACTIC ACID, PLASMA  LACTIC ACID, PLASMA      EKG   EKG Interpretation Date/Time:  Saturday April 02 2023 16:36:13 EDT Ventricular Rate:  109 PR Interval:  128 QRS Duration:  96 QT Interval:  327 QTC Calculation: 441 R Axis:   39  Text Interpretation: Sinus tachycardia Low voltage, precordial leads  Confirmed by Amaliya Whitelaw [phone]) on 04/02/2023 10:16:43 PM         Imaging Studies ordered: I ordered imaging studies including chest x-ray,  hip x-ray, CT hip I independently visualized and interpreted imaging. I agree with the radiologist interpretation   Medicines ordered and prescription drug management: Meds ordered this encounter  Medications   ceFEPIme (MAXIPIME) 2 g in sodium chloride 0.9 % 100 mL IVPB   DISCONTD: vancomycin (VANCOCIN) IVPB 1000 mg/200 mL premix    Order Specific Question:   Indication:    Answer:   Sepsis   AND Linked Order Group    vancomycin (VANCOCIN) IVPB 1000 mg/200 mL premix     Order Specific Question:   Indication:     Answer:   Sepsis    vancomycin (VANCOCIN) IVPB 1000 mg/200 mL premix     Order Specific Question:   Indication:     Answer:   Sepsis   vancomycin (VANCOREADY) IVPB 2000 mg/400 mL    Order Specific Question:   Indication:    Answer:   Sepsis   iohexol (OMNIPAQUE) 300 MG/ML solution 100 mL   lactated ringers bolus 1,000 mL   morphine (PF) 4 MG/ML injection 4 mg   ondansetron (ZOFRAN) injection 4 mg   morphine (PF) 4 MG/ML injection 4 mg   fentaNYL (SUBLIMAZE) injection 50 mcg   lactated ringers bolus 1,000 mL   acetaminophen (TYLENOL) tablet 1,000 mg   HYDROmorphone (DILAUDID) injection 1 mg    -I have reviewed the patients home medicines and have made adjustments as needed  Critical interventions Broad-spectrum antibiotics, fluid resuscitation, pain control, emergent transfer  Consultations Obtained: I requested consultation with the orthopedic surgeon Dr. Dorris Carnes,  and discussed lab and imaging findings as well as pertinent plan - they recommend: Transfer to [facility]   Cardiac Monitoring: The patient was maintained on a cardiac monitor.  I personally viewed and interpreted the cardiac monitored which showed an underlying rhythm of: Sinus tachycardia  Social Determinants of Health:  Factors impacting patients care include: none   Reevaluation: After the interventions noted above, I reevaluated the patient and found that they have :improved  Co  morbidities that complicate the patient evaluation  Past Medical History:  Diagnosis Date   Cancer (HCC)    GERD (gastroesophageal reflux disease)    Hyperlipidemia       Dispostion: I considered admission for this patient, and given concern for postoperative infection patient will call transfer to [facility]     Final Clinical Impression(s) / ED Diagnoses Final diagnoses:  Postoperative infection, unspecified type, initial encounter     @PCDICTATION @    Glendora Score, MD 04/02/23 2217    Glendora Score, MD 04/02/23 2225

## 2023-04-02 NOTE — Progress Notes (Signed)
Pharmacy Antibiotic Note  Veronica Mcclain is a 72 y.o. female admitted on 04/02/2023 with sepsis.  Pharmacy has been consulted for vancomycin dosing.  Plan: Vancomycin 2g IV q24h for estimated AUC 507 using SCr 0.8 Check vancomycin levels at steady state, goal AUC 400-550 Follow up renal function & cultures, clinical course, length of therapy  Height: 5\' 9"  (175.3 cm) Weight: 90.7 kg (200 lb) IBW/kg (Calculated) : 66.2  Temp (24hrs), Avg:101.9 F (38.8 C), Min:101.9 F (38.8 C), Max:101.9 F (38.8 C)  Recent Labs  Lab 04/02/23 1743  WBC 11.5*  CREATININE 0.74    Estimated Creatinine Clearance: 77.4 mL/min (by C-G formula based on SCr of 0.74 mg/dL).    Allergies  Allergen Reactions   Sulfa Antibiotics Nausea And Vomiting   Latex Swelling    Antimicrobials this admission: 7/20 Cefepime x 1 7/20 Vancomycin >>  Dose adjustments this admission:  Microbiology results: 7/20 BCx:  Thank you for allowing pharmacy to be a part of this patient's care.  Loralee Pacas, PharmD, BCPS 04/02/2023 6:15 PM

## 2023-04-02 NOTE — ED Triage Notes (Addendum)
Pt to ED via EMS form home with c/o left hip pain after recent hip replacement, pt has fever, pain is uncontrolled. Pt was given 10 mg oxycodone  30 minutes prior to arrival and Fentanyl patch 12 mcg/hr placed yesterday. CBG was 142 with ems. BP-165/89 tachy at 106 with EMS, 97% on room air. Pt says pain is a 5 of 10 now, down from 10 of 10 after receiving oxycodone. Pt is alert, but drowsy, knows her name and that she is at hospital, confusion present. Dr Posey Rea at bedside.

## 2023-04-02 NOTE — ED Notes (Signed)
Critical lab: Lactic acid 2.9.

## 2023-04-02 NOTE — ED Notes (Signed)
Attempted to get second Iv AND LABS WITHOUT SUCCESS X 2

## 2023-04-03 LAB — LACTIC ACID, PLASMA: Lactic Acid, Venous: 2.9 mmol/L (ref 0.5–1.9)

## 2023-04-03 LAB — C-REACTIVE PROTEIN: CRP: 4.9 mg/dL — ABNORMAL HIGH (ref <1.0)

## 2023-04-03 NOTE — ED Provider Notes (Signed)
Reevaluation prior to transfer and patient appears stable. Looks like she feels unwell but VSS and WNL aside from mild tachycardia that has improved. Has had abx. Understands transfer. [facility] to transport.    Vitals:   04/02/23 2345 04/03/23 0000  BP: (!) 116/59 114/62  Pulse: (!) 102 (!) 101  Resp: 19 16  Temp:    SpO2: 99% 100%      Terease Marcotte, Barbara Cower, MD 04/03/23 0011

## 2023-04-04 LAB — CULTURE, BLOOD (ROUTINE X 2): Culture: NO GROWTH

## 2023-04-05 LAB — CULTURE, BLOOD (ROUTINE X 2)

## 2023-04-07 LAB — CULTURE, BLOOD (ROUTINE X 2)
